# Patient Record
Sex: Female | Born: 1955 | Race: Black or African American | Hispanic: No | Marital: Single | Smoking: Current every day smoker
Health system: Southern US, Community
[De-identification: ages and names within clinical notes are randomized; demographics above are authoritative.]

## PROBLEM LIST (undated history)

## (undated) DIAGNOSIS — I10 Essential (primary) hypertension: Secondary | ICD-10-CM

## (undated) HISTORY — PX: ABDOMINAL HYSTERECTOMY: SHX81

## (undated) HISTORY — PX: TOTAL ABDOMINAL HYSTERECTOMY W/ BILATERAL SALPINGOOPHORECTOMY: SHX83

## (undated) HISTORY — DX: Essential (primary) hypertension: I10

---

## 2004-06-18 ENCOUNTER — Emergency Department: Payer: Self-pay | Admitting: Internal Medicine

## 2005-07-11 ENCOUNTER — Emergency Department: Payer: Self-pay | Admitting: Internal Medicine

## 2010-05-15 ENCOUNTER — Inpatient Hospital Stay: Payer: Self-pay | Admitting: Obstetrics and Gynecology

## 2010-05-18 LAB — PATHOLOGY REPORT

## 2013-08-25 ENCOUNTER — Emergency Department: Payer: Self-pay | Admitting: Emergency Medicine

## 2015-12-24 ENCOUNTER — Emergency Department: Payer: Self-pay

## 2015-12-24 ENCOUNTER — Emergency Department
Admission: EM | Admit: 2015-12-24 | Discharge: 2015-12-24 | Disposition: A | Discharge status: Home / Self Care | Payer: Self-pay | Attending: Emergency Medicine | Admitting: Emergency Medicine

## 2015-12-24 DIAGNOSIS — F17200 Nicotine dependence, unspecified, uncomplicated: Secondary | ICD-10-CM | POA: Insufficient documentation

## 2015-12-24 DIAGNOSIS — R51 Headache: Secondary | ICD-10-CM | POA: Insufficient documentation

## 2015-12-24 DIAGNOSIS — I10 Essential (primary) hypertension: Secondary | ICD-10-CM | POA: Insufficient documentation

## 2015-12-24 LAB — CBC
HCT: 39.8 % (ref 35.0–47.0)
Hemoglobin: 13.7 g/dL (ref 12.0–16.0)
MCH: 25.5 pg — AB (ref 26.0–34.0)
MCHC: 34.3 g/dL (ref 32.0–36.0)
MCV: 74.3 fL — ABNORMAL LOW (ref 80.0–100.0)
PLATELETS: 236 10*3/uL (ref 150–440)
RBC: 5.36 MIL/uL — ABNORMAL HIGH (ref 3.80–5.20)
RDW: 16.2 % — ABNORMAL HIGH (ref 11.5–14.5)
WBC: 5.5 10*3/uL (ref 3.6–11.0)

## 2015-12-24 LAB — COMPREHENSIVE METABOLIC PANEL
ALBUMIN: 4.1 g/dL (ref 3.5–5.0)
ALT: 11 U/L — AB (ref 14–54)
AST: 14 U/L — AB (ref 15–41)
Alkaline Phosphatase: 81 U/L (ref 38–126)
Anion gap: 8 (ref 5–15)
BUN: 13 mg/dL (ref 6–20)
CHLORIDE: 105 mmol/L (ref 101–111)
CO2: 25 mmol/L (ref 22–32)
CREATININE: 0.63 mg/dL (ref 0.44–1.00)
Calcium: 9.1 mg/dL (ref 8.9–10.3)
GFR calc non Af Amer: >60 mL/min (ref >60)
GLUCOSE: 102 mg/dL — AB (ref 65–99)
Potassium: 3.2 mmol/L — ABNORMAL LOW (ref 3.5–5.1)
SODIUM: 138 mmol/L (ref 135–145)
Total Bilirubin: 0.8 mg/dL (ref 0.3–1.2)
Total Protein: 7.2 g/dL (ref 6.5–8.1)

## 2015-12-24 LAB — TROPONIN I: Troponin I: <0.03 ng/mL (ref <0.03)

## 2015-12-24 MED ORDER — LABETALOL HCL 5 MG/ML IV SOLN
INTRAVENOUS | Status: DC
Start: 2015-12-24 — End: 2015-12-24
  Filled 2015-12-24: qty 4

## 2015-12-24 MED ORDER — LABETALOL HCL 5 MG/ML IV SOLN
INTRAVENOUS | Status: AC
  Administered 2015-12-24: 10 mg via INTRAVENOUS
  Filled 2015-12-24: qty 4

## 2015-12-24 MED ORDER — MORPHINE SULFATE (PF) 2 MG/ML IV SOLN
INTRAVENOUS | Status: AC
  Administered 2015-12-24: 2 mg via INTRAVENOUS
  Filled 2015-12-24: qty 1

## 2015-12-24 MED ORDER — LABETALOL HCL 5 MG/ML IV SOLN
10.0000 mg | Freq: Once | INTRAVENOUS | Status: AC
  Administered 2015-12-24: 10 mg via INTRAVENOUS

## 2015-12-24 MED ORDER — MORPHINE SULFATE (PF) 2 MG/ML IV SOLN
2.0000 mg | Freq: Once | INTRAVENOUS | Status: AC
  Administered 2015-12-24: 2 mg via INTRAVENOUS

## 2015-12-24 MED ORDER — CLONIDINE HCL 0.1 MG PO TABS
ORAL_TABLET | ORAL | Status: AC
  Administered 2015-12-24: 0.1 mg via ORAL
  Filled 2015-12-24: qty 1

## 2015-12-24 MED ORDER — AMLODIPINE BESYLATE 10 MG PO TABS: 10.0000 mg | ORAL_TABLET | Freq: Every day | ORAL | Status: DC

## 2015-12-24 NOTE — ED Notes (Signed)
Pt presents to ED via [facility] from [employer] (her job). Pt states she hasn't been feeling well the last couple of days. At work tonight she felt weak, had some chest pain. Blurred vision began yesterday. With EMS initial BP 202/100, last BP 190/98. Pt has no hx of HTN but it runs in the family. Pt c/o of headache at this moment.

## 2015-12-24 NOTE — Discharge Instructions (Signed)
Hypertension Hypertension, commonly called high blood pressure, is when the force of blood pumping through your arteries is too strong. Your arteries are the blood vessels that carry blood from your heart throughout your body. A blood pressure reading consists of a higher number over a lower number, such as 110/72. The higher number (systolic) is the pressure inside your arteries when your heart pumps. The lower number (diastolic) is the pressure inside your arteries when your heart relaxes. Ideally you want your blood pressure below 120/80. Hypertension forces your heart to work harder to pump blood. Your arteries may become narrow or stiff. Having untreated or uncontrolled hypertension can cause heart attack, stroke, kidney disease, and other problems. RISK FACTORS Some risk factors for high blood pressure are controllable. Others are not.  Risk factors you cannot control include:   Race. You may be at higher risk if you are African American.  Age. Risk increases with age.  Gender. Men are at higher risk than women before age 45 years. After age 65, women are at higher risk than men. Risk factors you can control include:  Not getting enough exercise or physical activity.  Being overweight.  Getting too much fat, sugar, calories, or salt in your diet.  Drinking too much alcohol. SIGNS AND SYMPTOMS Hypertension does not usually cause signs or symptoms. Extremely high blood pressure (hypertensive crisis) may cause headache, anxiety, shortness of breath, and nosebleed. DIAGNOSIS To check if you have hypertension, your health care provider will measure your blood pressure while you are seated, with your arm held at the level of your heart. It should be measured at least twice using the same arm. Certain conditions can cause a difference in blood pressure between your right and left arms. A blood pressure reading that is higher than normal on one occasion does not mean that you need treatment. If  it is not clear whether you have high blood pressure, you may be asked to return on a different day to have your blood pressure checked again. Or, you may be asked to monitor your blood pressure at home for 1 or more weeks. TREATMENT Treating high blood pressure includes making lifestyle changes and possibly taking medicine. Living a healthy lifestyle can help lower high blood pressure. You may need to change some of your habits. Lifestyle changes may include:  Following the DASH diet. This diet is high in fruits, vegetables, and whole grains. It is low in salt, red meat, and added sugars.  Keep your sodium intake below 2,300 mg per day.  Getting at least 30-45 minutes of aerobic exercise at least 4 times per week.  Losing weight if necessary.  Not smoking.  Limiting alcoholic beverages.  Learning ways to reduce stress. Your health care provider may prescribe medicine if lifestyle changes are not enough to get your blood pressure under control, and if one of the following is true:  You are 18-59 years of age and your systolic blood pressure is above 140.  You are 60 years of age or older, and your systolic blood pressure is above 150.  Your diastolic blood pressure is above 90.  You have diabetes, and your systolic blood pressure is over 140 or your diastolic blood pressure is over 90.  You have kidney disease and your blood pressure is above 140/90.  You have heart disease and your blood pressure is above 140/90. Your personal target blood pressure may vary depending on your medical conditions, your age, and other factors. HOME CARE INSTRUCTIONS    Have your blood pressure rechecked as directed by your health care provider.   Take medicines only as directed by your health care provider. Follow the directions carefully. Blood pressure medicines must be taken as prescribed. The medicine does not work as well when you skip doses. Skipping doses also puts you at risk for  problems.  Do not smoke.   Monitor your blood pressure at home as directed by your health care provider. SEEK MEDICAL CARE IF:   You think you are having a reaction to medicines taken.  You have recurrent headaches or feel dizzy.  You have swelling in your ankles.  You have trouble with your vision. SEEK IMMEDIATE MEDICAL CARE IF:  You develop a severe headache or confusion.  You have unusual weakness, numbness, or feel faint.  You have severe chest or abdominal pain.  You vomit repeatedly.  You have trouble breathing. MAKE SURE YOU:   Understand these instructions.  Will watch your condition.  Will get help right away if you are not doing well or get worse.   This information is not intended to replace advice given to you by your health care provider. Make sure you discuss any questions you have with your health care provider.   Document Released: 05/23/2005 Document Revised: 10/07/2014 Document Reviewed: 03/15/2013 Elsevier Interactive Patient Education 2016 Elsevier Inc.  

## 2015-12-24 NOTE — ED Notes (Signed)
Family at bedside. 

## 2015-12-24 NOTE — ED Notes (Signed)
Urged patient to urinate for lab work.

## 2015-12-24 NOTE — ED Notes (Signed)
Patient returned from CT

## 2015-12-24 NOTE — ED Notes (Signed)
Told patient we needed to get urine from her to send to the lab, she says she does not have to go right now but will push the call button if she feels the need.  Commode hat placed.

## 2015-12-24 NOTE — ED Notes (Signed)
MD at bedside. 

## 2015-12-24 NOTE — ED Notes (Signed)
Patient transported to CT 

## 2015-12-24 NOTE — ED Provider Notes (Signed)
[facility] Emergency Department Provider Note  ____________________________________________  Time seen: 1:00 AM  I have reviewed the triage vital signs and the nursing notes.   HISTORY  Chief Complaint Hypertension     HPI Sue Richardson is a 60 y.o. female presents emergency department from [facility] via EMS with complaint of generalized weakness headache blurred vision with "seeing little black things"since yesterday patient admits to 8 out of 10 headache that is generalized. Patient's blood pressure noted to be 202/100 by EMS. Patient denies any weakness numbness or gait instability. Patient denies any change in speech. Patient denies any chest pain at this time Patient denies any previous history of hypertension states that she has not seen a doctor in many many years     No past medical history on file.  There are no active problems to display for this patient.   Past Surgical History  Procedure Laterality Date  . Abdominal hysterectomy      No current outpatient prescriptions on file.  Allergies Review of patient's allergies indicates no known allergies.  No family history on file.  Social History Social History  Substance Use Topics  . Smoking status: Current Every Day Smoker  . Smokeless tobacco: Not on file  . Alcohol Use: No    Review of Systems  Constitutional: Negative for fever. Eyes: Negative for visual changes. ENT: Negative for sore throat. Cardiovascular: Negative for chest pain. Respiratory: Negative for shortness of breath. Gastrointestinal: Negative for abdominal pain, vomiting and diarrhea. Genitourinary: Negative for dysuria. Musculoskeletal: Negative for back pain. Skin: Negative for rash. Neurological: Positive for headaches.   10-point ROS otherwise negative.  ____________________________________________   PHYSICAL EXAM:  VITAL SIGNS: ED Triage Vitals  Enc Vitals Group     BP 12/24/15 0050  212/102 mmHg     Pulse Rate 12/24/15 0050 70     Resp 12/24/15 0050 14     Temp 12/24/15 0050 97.5 F (36.4 C)     Temp Source 12/24/15 0050 Oral     SpO2 12/24/15 0050 100 %     Weight 12/24/15 0050 168 lb (76.204 kg)     Height 12/24/15 0050  (1.702 m)     Head Cir --      Peak Flow --      Pain Score 12/24/15 0051 9     Pain Loc --      Pain Edu? --      Excl. in GC? --      Constitutional: Alert and oriented. Well appearing and in no distress. Eyes: Conjunctivae are normal. PERRL. Normal extraocular movements. ENT   Head: Normocephalic and atraumatic.   Nose: No congestion/rhinnorhea.   Mouth/Throat: Mucous membranes are moist.   Neck: No stridor. Hematological/Lymphatic/Immunilogical: No cervical lymphadenopathy. Cardiovascular: Normal rate, regular rhythm. Normal and symmetric distal pulses are present in all extremities. No murmurs, rubs, or gallops. Respiratory: Normal respiratory effort without tachypnea nor retractions. Breath sounds are clear and equal bilaterally. No wheezes/rales/rhonchi. Gastrointestinal: Soft and nontender. No distention. There is no CVA tenderness. Genitourinary: deferred Musculoskeletal: Nontender with normal range of motion in all extremities. No joint effusions.  No lower extremity tenderness nor edema. Neurologic:  Normal speech and language. No gross focal neurologic deficits are appreciated. Speech is normal.  Skin:  Skin is warm, dry and intact. No rash noted. Psychiatric: Mood and affect are normal. Speech and behavior are normal. Patient exhibits appropriate insight and judgment.  ____________________________________________    LABS (pertinent positives/negatives) Labs  Reviewed  CBC - Abnormal; Notable for the following:    RBC 5.36 (*)    MCV 74.3 (*)    MCH 25.5 (*)    RDW 16.2 (*)    All other components within normal limits  COMPREHENSIVE METABOLIC PANEL - Abnormal; Notable for the following:    Potassium  3.2 (*)    Glucose, Bld 102 (*)    AST 14 (*)    ALT 11 (*)    All other components within normal limits  TROPONIN I  URINE DRUG SCREEN, QUALITATIVE ([facility] ONLY)     ____________________________________________   EKG  ED ECG REPORT I, Sentinel N Isiah Scheel, the attending physician, personally viewed and interpreted this ECG.   Date: 12/24/2015  EKG Time: 12:48AM  Rate: 59  Rhythm: Normal Sinus Rhythm  Axis: Normal  Intervals:Normal  ST&T Change: None   ____________________________________________    RADIOLOGY     CT Head Wo Contrast (Final result) Result time: 12/24/15 01:57:41   Final result by Rad Results In Interface (12/24/15 01:57:41)   Narrative:   CLINICAL DATA: Weakness, chest pain and blurry vision for a few days, hypertensive. Headache.  EXAM: CT HEAD WITHOUT CONTRAST  TECHNIQUE: Contiguous axial images were obtained from the base of the skull through the vertex without intravenous contrast.  COMPARISON: None.  FINDINGS: INTRACRANIAL CONTENTS: The ventricles and sulci are normal. No intraparenchymal hemorrhage, mass effect nor midline shift. No acute large vascular territory infarcts. No abnormal extra-axial fluid collections. Basal cisterns are patent. Trace calcific atherosclerosis the carotid siphons.  ORBITS: The included ocular globes and orbital contents are normal.  SINUSES: The mastoid aircells and included paranasal sinuses are well-aerated.  SKULL/SOFT TISSUES: No skull fracture. No significant soft tissue swelling.  IMPRESSION: Normal CT HEAD.   Electronically Signed By: Awilda Metroourtnay Bloomer M.D. On: 12/24/2015 01:57            Procedures     INITIAL IMPRESSION / ASSESSMENT AND PLAN / ED COURSE  Pertinent labs & imaging results that were available during my care of the patient were reviewed by me and considered in my medical decision making (see chart for details).   Patient with markedly elevated blood  pressure with no PMH of hypertension &  Headache. Patient given Labetalol 10 mg within improvement of BP. CT head negative, lab data unremarkable EKG negative  ____________________________________________   FINAL CLINICAL IMPRESSION(S) / ED DIAGNOSES  Final diagnoses:  Essential hypertension      Darci Currentandolph N Suzann Lazaro, MD 12/24/15 [phone]

## 2016-09-28 ENCOUNTER — Ambulatory Visit
Admission: RE | Admit: 2016-09-28 | Discharge: 2016-09-28 | Disposition: A | Discharge status: Home / Self Care | Payer: Self-pay | Source: Ambulatory Visit | Attending: Oncology | Admitting: Oncology

## 2016-09-28 ENCOUNTER — Encounter: Payer: Self-pay | Admitting: *Deleted

## 2016-09-28 ENCOUNTER — Ambulatory Visit: Payer: Self-pay | Attending: Oncology | Admitting: *Deleted

## 2016-09-28 VITALS — BP 121/79 | HR 76 | Temp 97.9°F | Ht 65.0 in | Wt 187.0 lb

## 2016-09-28 DIAGNOSIS — Z0000 Encounter for general adult medical examination without abnormal findings: Secondary | ICD-10-CM

## 2016-09-28 NOTE — Patient Instructions (Signed)
Gave patient hand-out, Women Staying Healthy, Active and Well from [facility], with education on breast health, pap smears, heart and colon health. 

## 2016-09-28 NOTE — Progress Notes (Signed)
Subjective:     Patient ID: Sue Richardson, female   DOB: [birth year], 61 y.o.   MRN: [id number]  HPI   Review of Systems     Objective:   Physical Exam  Pulmonary/Chest: Right breast exhibits no inverted nipple, no mass, no nipple discharge, no skin change and no tenderness. Left breast exhibits tenderness. Left breast exhibits no inverted nipple, no mass, no nipple discharge and no skin change. Breasts are asymmetrical.  Right breast 1-2 cup sizes larger than the left -        Assessment:     61 year old Black female presents to [facility] for clinical breast exam and mammogram only.  Patient states she has been having intermittent left breast pain for about 6 months.  States it under her breast, feels heavy, and stabbing at times.  No aggravating factors, and states she is better since being on her blood pressure meds for the last 2 months.  When asked about associated symptoms, she states she breaks out in a sweat and feels heaviness in her chest when she has this pain.  Reviewed signs and symptoms of cardiac arrest.  Encouraged her to go to the ED the next time she experiences this type of pain, and to inform her primary care provider at her follow-up appointment next month.  Taught self breast awareness.  Patient has had a total hysterectomy for heavy bleeding.  Pap smear omitted per protocol.  Patient has been screened for eligibility.  She does not have any insurance, Medicare or Medicaid.  She also meets financial eligibility.  Hand-out given on the Affordable Care Act.    Plan:     Screening mammogram ordered.  Will follow-up per protocol.

## 2016-10-04 ENCOUNTER — Encounter: Payer: Self-pay | Admitting: *Deleted

## 2016-10-04 NOTE — Progress Notes (Signed)
Letter mailed from the [facility] to inform patient of her normal mammogram results.  Patient is to follow-up with annual screening in one year.  [facility] to [facility]y. 

## 2017-06-11 IMAGING — CT CT HEAD W/O CM
3 series · 16 of 47 positions shown, 19 images · non-contrast
Comparison: None.

CLINICAL DATA: Weakness, chest pain and blurry vision for a few
days, hypertensive. Headache.

EXAM:
CT HEAD WITHOUT CONTRAST
TECHNIQUE: Contiguous axial images were obtained from the base of the skull
through the vertex without intravenous contrast.

[Series 2: head wo · axial · 0.38mm/px · z∈[-52,+73]mm · 10 of 31 slices shown, 13 images]
[im 3/31  brain]
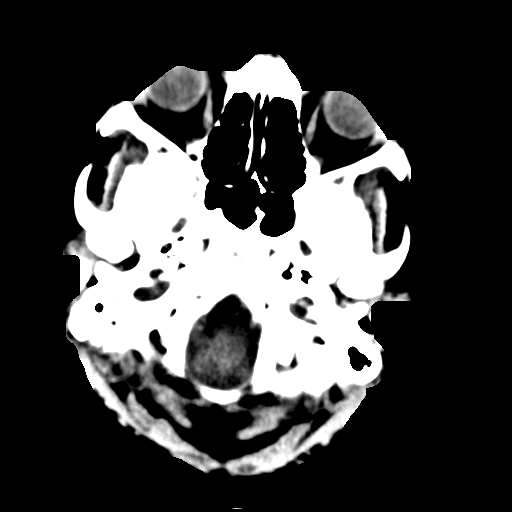
[im 3/31  bone]
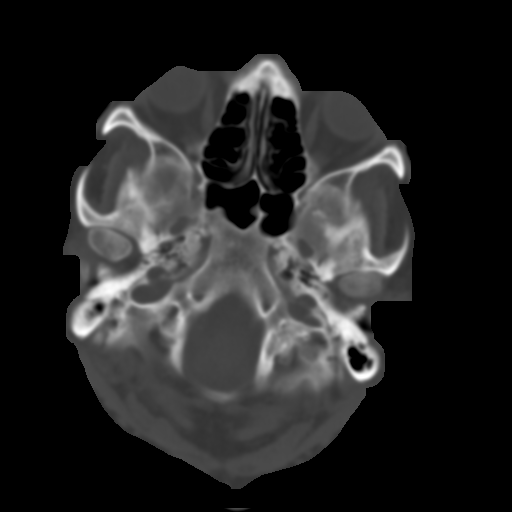
[im 6/31  brain]
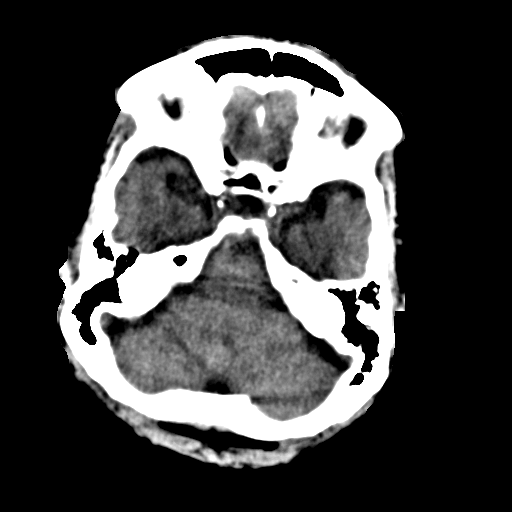
[im 9/31  brain]
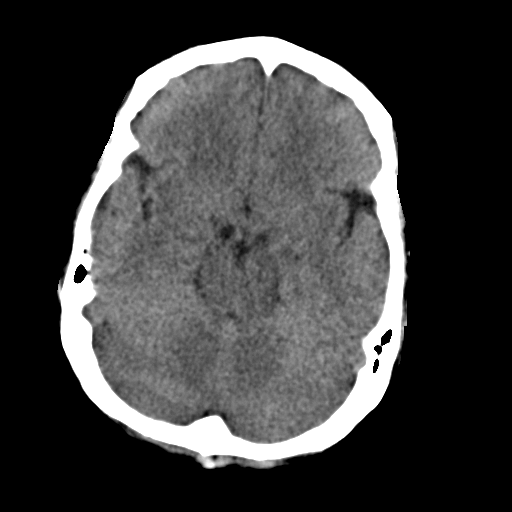
[im 11/31  brain]
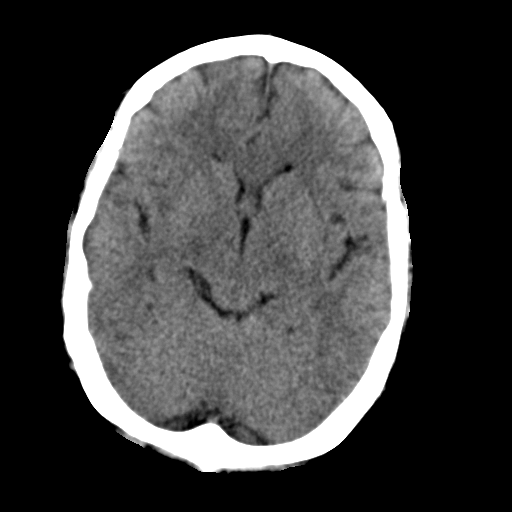
[im 14/31  brain]
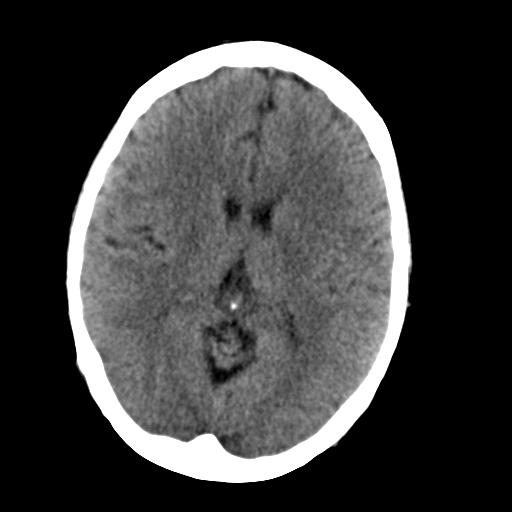
[im 14/31  bone]
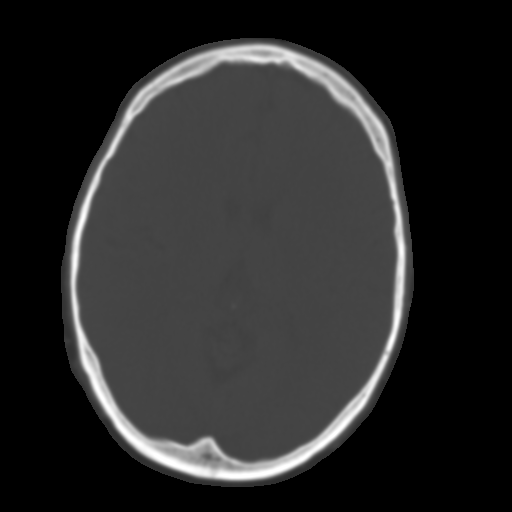
[im 17/31  brain]
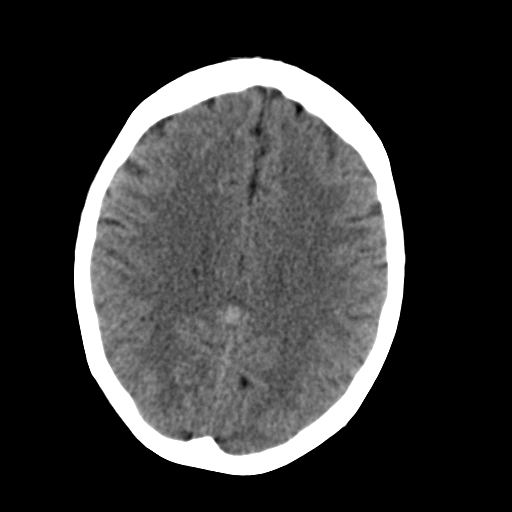
[im 20/31  brain]
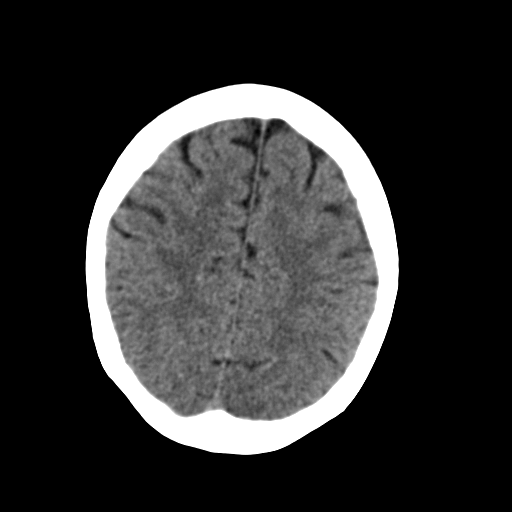
[im 23/31  brain]
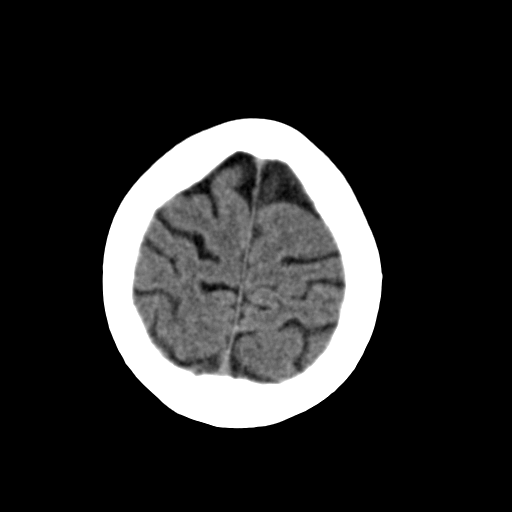
[im 25/31  brain]
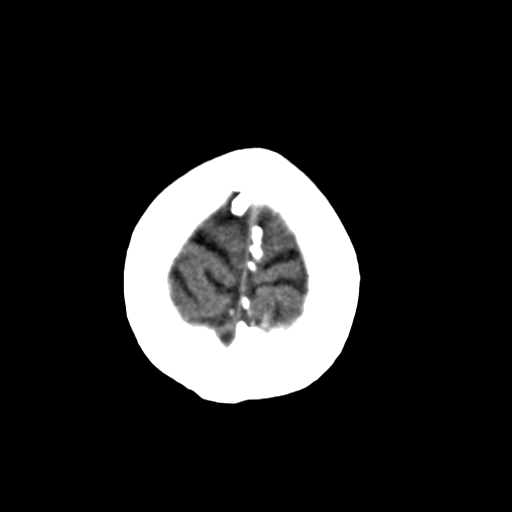
[im 25/31  bone]
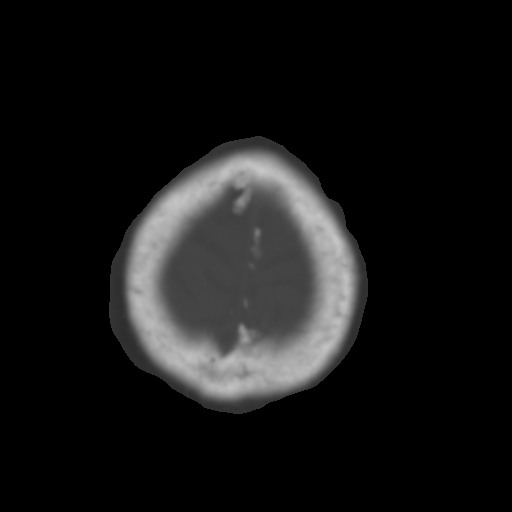
[im 28/31  brain]
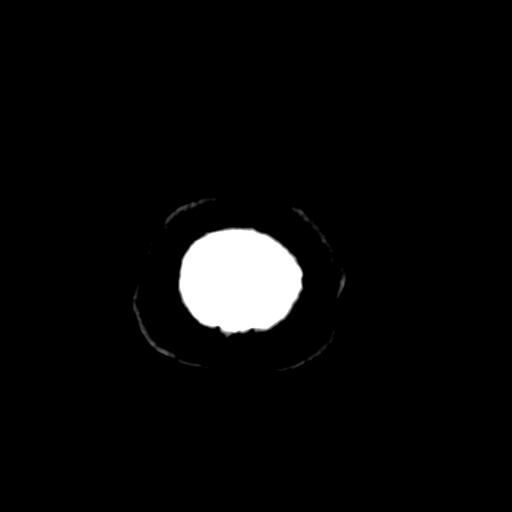

[Series 4: coronal soft · coronal · 0.32mm/px · 3 of 55 slices shown]
[im 19/55  brain]
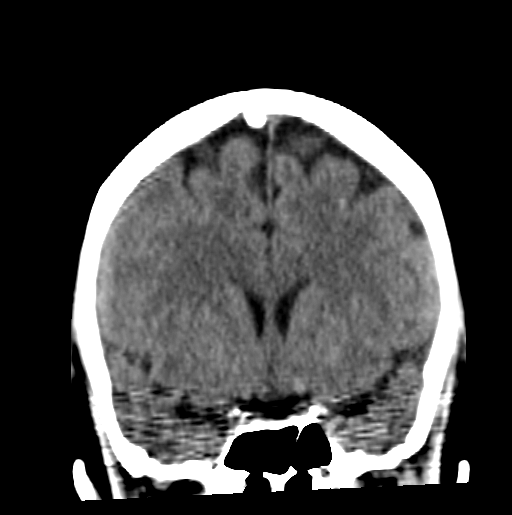
[im 25/55  brain]
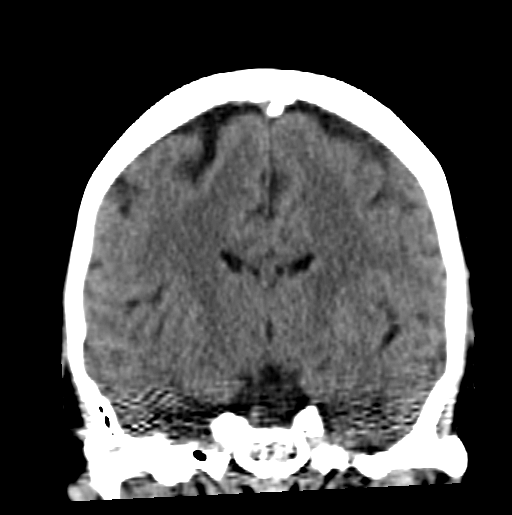
[im 31/55  brain]
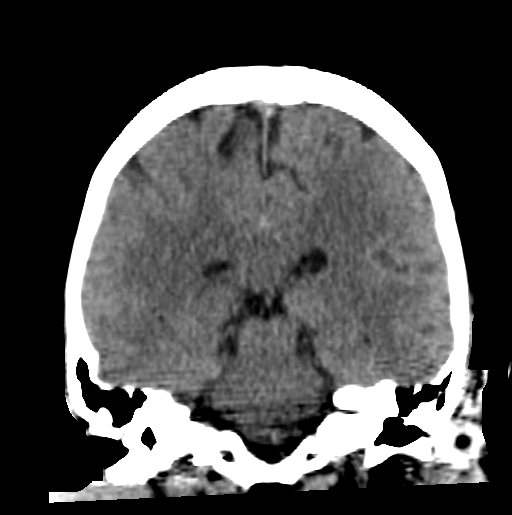

[Series 5: sagittal soft · sagittal · 0.30mm/px · 3 of 48 slices shown]
[im 16/48  brain]
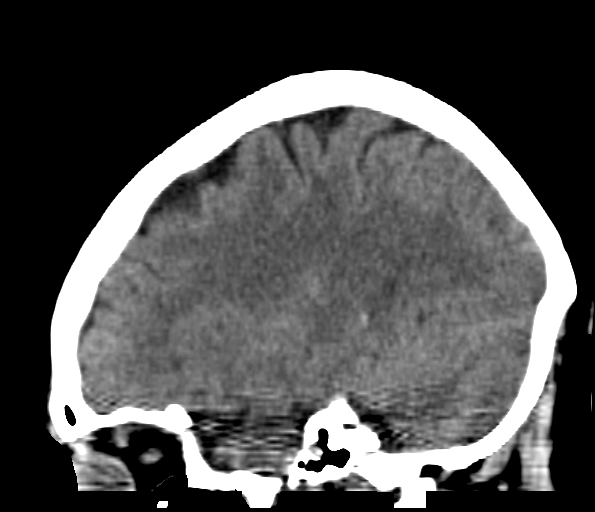
[im 24/48  brain]
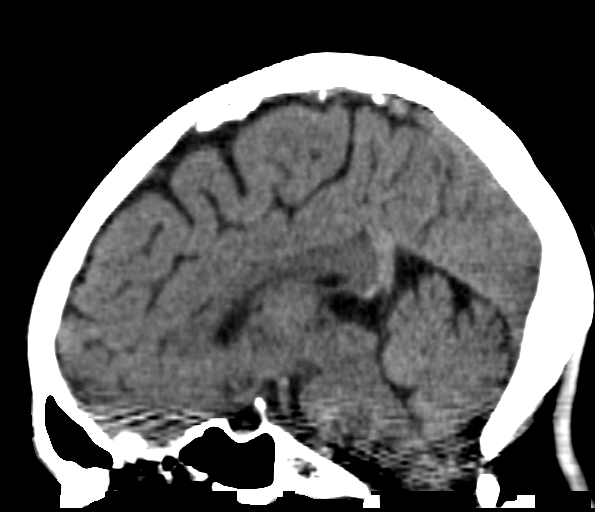
[im 32/48  brain]
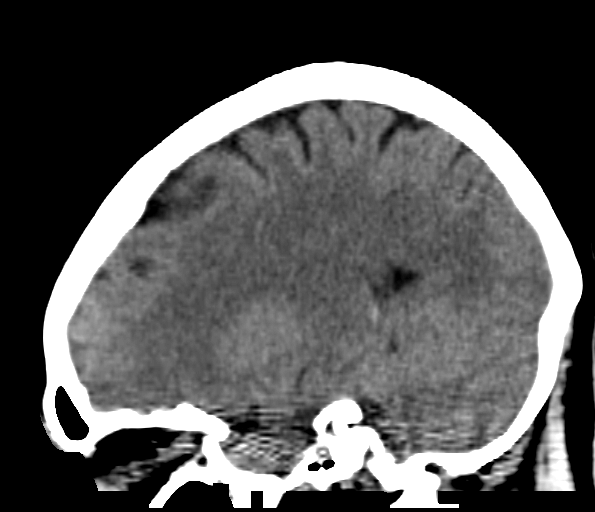

[16 of 47 positions shown; findings below may reference images not displayed]

FINDINGS: INTRACRANIAL CONTENTS: The ventricles and sulci are normal. No
intraparenchymal hemorrhage, mass effect nor midline shift. No acute
large vascular territory infarcts. No abnormal extra-axial fluid
collections. Basal cisterns are patent. Trace calcific
atherosclerosis the carotid siphons.

ORBITS: The included ocular globes and orbital contents are normal.

SINUSES: The mastoid aircells and included paranasal sinuses are
well-aerated.

SKULL/SOFT TISSUES: No skull fracture. No significant soft tissue
swelling.
IMPRESSION: Normal CT HEAD.

## 2019-03-27 ENCOUNTER — Ambulatory Visit
Admission: RE | Admit: 2019-03-27 | Discharge: 2019-03-27 | Disposition: A | Discharge status: Home / Self Care | Payer: Self-pay | Source: Ambulatory Visit | Attending: Oncology | Admitting: Oncology

## 2019-03-27 ENCOUNTER — Other Ambulatory Visit: Payer: Self-pay

## 2019-03-27 ENCOUNTER — Ambulatory Visit: Payer: Self-pay | Attending: Oncology

## 2019-03-27 VITALS — BP 199/91 | HR 66 | Temp 98.4°F | Ht 64.0 in | Wt 185.6 lb

## 2019-03-27 DIAGNOSIS — Z0000 Encounter for general adult medical examination without abnormal findings: Secondary | ICD-10-CM | POA: Insufficient documentation

## 2019-03-27 NOTE — Progress Notes (Signed)
  Subjective:     Patient ID: Sue Richardson, female   DOB: 23-Feb-1956, 63 y.o.   MRN: [id number]  HPI   Review of Systems     Objective:   Physical Exam Chest:     Breasts:        Right: No swelling, bleeding, inverted nipple, mass, nipple discharge, skin change or tenderness.        Left: No swelling, bleeding, inverted nipple, mass, nipple discharge, skin change or tenderness.     Comments: Large pendulous breasts       Assessment:     63 year old patient presents for [facility] clinic visit.  Patient screened, and meets [facility] eligibility.  Patient does not have insurance, Medicare or Medicaid. Instructed patient on breast self awareness using teach back method.  Clinical breast exam unremarkable.  No mass or lump palpated. Date of birth [birth year]    Plan:     Sent for bilateral screening mammogram.

## 2019-03-28 NOTE — Progress Notes (Signed)
Letter mailed from [facility] to notify of normal mammogram results.  Patient to return in one year for annual screening.  Copy to [facility]. 

## 2021-03-26 ENCOUNTER — Ambulatory Visit (INDEPENDENT_AMBULATORY_CARE_PROVIDER_SITE_OTHER): Payer: Self-pay | Admitting: Nurse Practitioner

## 2021-03-26 ENCOUNTER — Other Ambulatory Visit: Payer: Self-pay

## 2021-03-26 ENCOUNTER — Encounter: Payer: Self-pay | Admitting: Nurse Practitioner

## 2021-03-26 VITALS — BP 131/69 | HR 81 | Temp 98.6°F | Ht 63.0 in | Wt 177.2 lb

## 2021-03-26 DIAGNOSIS — M25511 Pain in right shoulder: Secondary | ICD-10-CM

## 2021-03-26 DIAGNOSIS — G8929 Other chronic pain: Secondary | ICD-10-CM

## 2021-03-26 DIAGNOSIS — M545 Low back pain, unspecified: Secondary | ICD-10-CM

## 2021-03-26 DIAGNOSIS — Z599 Problem related to housing and economic circumstances, unspecified: Secondary | ICD-10-CM

## 2021-03-26 DIAGNOSIS — I10 Essential (primary) hypertension: Secondary | ICD-10-CM

## 2021-03-26 DIAGNOSIS — Z7689 Persons encountering health services in other specified circumstances: Secondary | ICD-10-CM

## 2021-03-26 DIAGNOSIS — E7800 Pure hypercholesterolemia, unspecified: Secondary | ICD-10-CM

## 2021-03-26 DIAGNOSIS — M25512 Pain in left shoulder: Secondary | ICD-10-CM

## 2021-03-26 MED ORDER — MELOXICAM 7.5 MG PO TABS
7.5000 mg | ORAL_TABLET | Freq: Every day | ORAL | 0 refills | Status: DC
  Filled 2021-03-26: qty 30, 30d supply, fill #0

## 2021-03-26 NOTE — Progress Notes (Signed)
BP 131/69   Pulse 81   Temp 98.6 F (37 C) (Oral)   Ht 5' 3"  (1.6 m)   Wt 177 lb 3.2 oz (80.4 kg)   SpO2 100%   BMI 31.39 kg/m    Subjective:    Patient ID: Sue Richardson, female    DOB: Mar 05, 1956, 65 y.o.   MRN: [id number]  HPI: Sue Richardson is a 65 y.o. female  Chief Complaint  Patient presents with   Establish Care    Patient states she fell back at work about 10 months ago. Patient states she had imaging completed and was informed that she has Arthritis and states she has been having issues with her shoulder, arm, neck and back since the fall. Patient states she has pain in both arms, but her left arm is worse than her right. As the left arm will catch and cause her pain.    Shoulder Pain   Neck Pain   Arm Pain   Back Pain   Toe Injury    Patient sister states the patient was doing some work and think she may have sprang her R big toe as it was swelling and it will come down and then go back to swelling. Patient would like to discuss with provider.    Patient presents to clinic to establish care with new PCP.  Patient reports a history of hypertension and high cholesterol.  Patient denies a history of:  Diabetes, Thyroid problems, Depression, Anxiety, Neurological problems, and Abdominal problems.   Patient states she fell at work 10 months.  Did not go through work [employer].  Since that time she has been having a lot of pain in her joints.  States that the imaging at that time showed that she has some arthritis.   TOE INJURY Patient states she was doing something and she turned around and had pain in her toe.  Patient states she thought it would go away.  States that it swelled up.  Patient states that was about 1 week ago.  Swelling is improving gradually.    Denies HA, CP, SOB, dizziness, palpitations, visual changes, and lower extremity swelling.   Active Ambulatory Problems    Diagnosis Date Noted   Primary hypertension 03/26/2021    Hypercholesteremia 03/26/2021   Resolved Ambulatory Problems    Diagnosis Date Noted   No Resolved Ambulatory Problems   Past Medical History:  Diagnosis Date   Hypertension    Past Surgical History:  Procedure Laterality Date   ABDOMINAL HYSTERECTOMY     Family History  Problem Relation Age of Onset   COPD Sister    Hypertension Sister    Stroke Sister    Breast cancer Neg Hx     Review of Systems  Eyes:  Negative for visual disturbance.  Respiratory:  Negative for cough, chest tightness and shortness of breath.   Cardiovascular:  Negative for chest pain, palpitations and leg swelling.  Musculoskeletal:  Positive for arthralgias.  Neurological:  Negative for dizziness and headaches.   Per HPI unless specifically indicated above     Objective:    BP 131/69   Pulse 81   Temp 98.6 F (37 C) (Oral)   Ht 5' 3"  (1.6 m)   Wt 177 lb 3.2 oz (80.4 kg)   SpO2 100%   BMI 31.39 kg/m   Wt Readings from Last 3 Encounters:  03/26/21 177 lb 3.2 oz (80.4 kg)  03/27/19 185 lb 9.6 oz (84.2 kg)  09/28/16 187 lb (84.8 kg)    Physical Exam Vitals and nursing note reviewed.  Constitutional:      General: She is not in acute distress.    Appearance: Normal appearance. She is normal weight. She is not ill-appearing, toxic-appearing or diaphoretic.  HENT:     Head: Normocephalic.     Right Ear: External ear normal.     Left Ear: External ear normal.     Nose: Nose normal.     Mouth/Throat:     Mouth: Mucous membranes are moist.     Pharynx: Oropharynx is clear.  Eyes:     General:        Right eye: No discharge.        Left eye: No discharge.     Extraocular Movements: Extraocular movements intact.     Conjunctiva/sclera: Conjunctivae normal.     Pupils: Pupils are equal, round, and reactive to light.  Cardiovascular:     Rate and Rhythm: Normal rate and regular rhythm.     Heart sounds: No murmur heard. Pulmonary:     Effort: Pulmonary effort is normal. No respiratory  distress.     Breath sounds: Normal breath sounds. No wheezing or rales.  Musculoskeletal:     Right shoulder: Decreased range of motion. Decreased strength.     Left shoulder: Decreased range of motion. Decreased strength.     Cervical back: Normal range of motion and neck supple.  Skin:    General: Skin is warm and dry.     Capillary Refill: Capillary refill takes less than 2 seconds.  Neurological:     General: No focal deficit present.     Mental Status: She is alert and oriented to person, place, and time. Mental status is at baseline.  Psychiatric:        Mood and Affect: Mood normal.        Behavior: Behavior normal.        Thought Content: Thought content normal.        Judgment: Judgment normal.    Results for orders placed or performed during the hospital encounter of 12/24/15  CBC  Result Value Ref Range   WBC 5.5 3.6 - 11.0 K/uL   RBC 5.36 (H) 3.80 - 5.20 MIL/uL   Hemoglobin 13.7 12.0 - 16.0 g/dL   HCT 39.8 35.0 - 47.0 %   MCV 74.3 (L) 80.0 - 100.0 fL   MCH 25.5 (L) 26.0 - 34.0 pg   MCHC 34.3 32.0 - 36.0 g/dL   RDW 16.2 (H) 11.5 - 14.5 %   Platelets 236 150 - 440 K/uL  Comprehensive metabolic panel  Result Value Ref Range   Sodium 138 135 - 145 mmol/L   Potassium 3.2 (L) 3.5 - 5.1 mmol/L   Chloride 105 101 - 111 mmol/L   CO2 25 22 - 32 mmol/L   Glucose, Bld 102 (H) 65 - 99 mg/dL   BUN 13 6 - 20 mg/dL   Creatinine, Ser 0.63 0.44 - 1.00 mg/dL   Calcium 9.1 8.9 - 10.3 mg/dL   Total Protein 7.2 6.5 - 8.1 g/dL   Albumin 4.1 3.5 - 5.0 g/dL   AST 14 (L) 15 - 41 U/L   ALT 11 (L) 14 - 54 U/L   Alkaline Phosphatase 81 38 - 126 U/L   Total Bilirubin 0.8 0.3 - 1.2 mg/dL   GFR calc non Af Amer >60 >60 mL/min   GFR calc Af Amer >60 >60 mL/min   Anion  gap 8 5 - 15  Troponin I  Result Value Ref Range   Troponin I <0.03 <0.03 ng/mL      Assessment & Plan:   Problem List Items Addressed This Visit       Cardiovascular and Mediastinum   Primary hypertension -  Primary    Chronic.  Controlled.  Continue with current medication regimen.  Labs ordered today.  Return to clinic in 1 months for reevaluation.  Call sooner if concerns arise.        Relevant Medications   atorvastatin (LIPITOR) 20 MG tablet   Other Relevant Orders   Comp Met (CMET)     Other   Hypercholesteremia    Chronic.  Controlled.  Continue with current medication regimen.  Labs ordered today.  Refills sent today.  Return to clinic in 1 months for reevaluation.  Call sooner if concerns arise.        Relevant Medications   atorvastatin (LIPITOR) 20 MG tablet   Other Relevant Orders   Lipid Profile   Other Visit Diagnoses     Financial difficulties       Referral placed for patient to speak with social worker for financial assistance with healthcare.    Relevant Orders   [facility]   Chronic pain of both shoulders       Referral placed for patient to do physical therapy. Would like patient to see [facility] due to no insurance. If charity care is an option would like to do imaging.   Relevant Medications   meloxicam (MOBIC) 7.5 MG tablet   Other Relevant Orders   Ambulatory referral to Physical Therapy   Chronic bilateral low back pain, unspecified whether sciatica present       Relevant Medications   meloxicam (MOBIC) 7.5 MG tablet   Other Relevant Orders   Ambulatory referral to Physical Therapy   Encounter to establish care            Follow up plan: Return in about 1 month (around 04/26/2021) for Medication managment.

## 2021-03-26 NOTE — Assessment & Plan Note (Signed)
Chronic.  Controlled.  Continue with current medication regimen.  Labs ordered today.  Return to clinic in 1 months for reevaluation.  Call sooner if concerns arise.   

## 2021-03-26 NOTE — Assessment & Plan Note (Signed)
Chronic.  Controlled.  Continue with current medication regimen.  Labs ordered today.  Refills sent today.  Return to clinic in 1 months for reevaluation.  Call sooner if concerns arise.

## 2021-03-27 LAB — COMPREHENSIVE METABOLIC PANEL
ALT: 10 IU/L (ref 0–32)
AST: 10 IU/L (ref 0–40)
Albumin/Globulin Ratio: 1.8 (ref 1.2–2.2)
Albumin: 4.6 g/dL (ref 3.8–4.8)
Alkaline Phosphatase: 96 IU/L (ref 44–121)
BUN/Creatinine Ratio: 23 (ref 12–28)
BUN: 14 mg/dL (ref 8–27)
Bilirubin Total: 0.3 mg/dL (ref 0.0–1.2)
CO2: 21 mmol/L (ref 20–29)
Calcium: 9.1 mg/dL (ref 8.7–10.3)
Chloride: 104 mmol/L (ref 96–106)
Creatinine, Ser: 0.61 mg/dL (ref 0.57–1.00)
Globulin, Total: 2.5 g/dL (ref 1.5–4.5)
Glucose: 82 mg/dL (ref 70–99)
Potassium: 4 mmol/L (ref 3.5–5.2)
Sodium: 143 mmol/L (ref 134–144)
Total Protein: 7.1 g/dL (ref 6.0–8.5)
eGFR: 100 mL/min/{1.73_m2} (ref >59)

## 2021-03-27 LAB — LIPID PANEL
Chol/HDL Ratio: 2.1 ratio (ref 0.0–4.4)
Cholesterol, Total: 177 mg/dL (ref 100–199)
HDL: 83 mg/dL (ref >39)
LDL Chol Calc (NIH): 86 mg/dL (ref 0–99)
Triglycerides: 38 mg/dL (ref 0–149)
VLDL Cholesterol Cal: 8 mg/dL (ref 5–40)

## 2021-03-29 NOTE — Progress Notes (Signed)
Hi Sue Richardson.  It was nice to meet you last week. Your lab work looks good.  No concerns at this time. Continue with your current medication regimen.  Follow up as discussed.  Please let me know if you have any questions.

## 2021-04-02 ENCOUNTER — Telehealth: Payer: Self-pay | Admitting: *Deleted

## 2021-04-02 NOTE — Telephone Encounter (Signed)
   Telephone encounter was:  Unsuccessful.  04/02/2021 Name: Sue Richardson MRN: [id number] DOB: [birth year]  Unsuccessful outbound call made today to assist with:  Food Insecurity  Outreach Attempt:  1st Attempt  A HIPAA compliant voice message was left requesting a return call.  Instructed patient to call back at   Instructed patient to call back at [phone]  at their earliest convenience. Yehuda Mao Greenauer -[facility] Guide , Embedded Care Coordination [facility], Care Management  [phone] [address] [place] , [place] [address] Email : [email or web address]

## 2021-04-12 ENCOUNTER — Telehealth: Payer: Self-pay | Admitting: *Deleted

## 2021-04-12 NOTE — Telephone Encounter (Signed)
   Telephone encounter was:  Unsuccessful.  04/12/2021 Name: Sue Richardson MRN: [id number] DOB: 02/19/1956  Unsuccessful outbound call made today to assist with:   insurance   Outreach Attempt:  1st Attempt  A HIPAA compliant voice message was left requesting a return call.  Instructed patient to call back at   Instructed patient to call back at [phone]  at their earliest convenience. Yehuda Mao Greenauer -[facility] Guide , Embedded Care Coordination [facility], Care Management  [phone] [address] [place] , [place] [address] Email : [email or web address]

## 2021-04-13 ENCOUNTER — Telehealth: Payer: Self-pay | Admitting: *Deleted

## 2021-04-13 NOTE — Telephone Encounter (Signed)
   Telephone encounter was:  Unsuccessful.  04/13/2021 Name: Sue Richardson MRN: [id number] DOB: Jul 17, 1955  Unsuccessful outbound call made today to assist with:  Food Insecurity  Outreach Attempt:  2nd Attempt  Cannot be reached at this time  Alois Cliche -[facility] Guide , Embedded Care Coordination [facility], Care Management  [phone] [address] [place] , [place] [address] Email : [email or web address]

## 2021-04-14 ENCOUNTER — Telehealth: Payer: Self-pay | Admitting: *Deleted

## 2021-04-14 NOTE — Telephone Encounter (Signed)
   Telephone encounter was:  Successful.  04/14/2021 Name: Sue Richardson MRN: [id number] DOB: 1955/06/23  Sue Richardson is a 65 y.o. year old female who is a primary care patient of Larae Grooms, NP . The community resource team was consulted for assistance with  Medicaid   Care guide performed the following interventions: Patient provided with information about care guide support team and interviewed to confirm resource needs Follow up call placed to community resources to determine status of patients referral.  Follow Up Plan:  No further follow up planned at this time. The patient has been provided with needed resources. Alois Cliche -[facility] Guide , Embedded Care Coordination [facility], Care Management  [phone] [address] , [place] [address] Email : [email or web address]

## 2021-04-23 NOTE — Progress Notes (Signed)
BP (!) 149/82   Pulse 79   Temp (!) 97.4 F (36.3 C) (Oral)   Ht _0  (1.6 m)   Wt 172 lb 9.6 oz (78.3 kg)   SpO2 99%   BMI 30.57 kg/m    Subjective:    Patient ID: Sue Richardson, female    DOB: 1955/07/28, 65 y.o.   MRN: [id number]  HPI: Sue Richardson is a 65 y.o. female  Chief Complaint  Patient presents with   Hyperlipidemia   Hypertension   PAIN Patient states her pain is still really bad.  States that the Mobic isn't helping her pain.    HYPERTENSION- patient did not take her medication today. Hypertension status: controlled  Satisfied with current treatment? yes Duration of hypertension: years BP monitoring frequency:  not checking BP range:  BP medication side effects:  no Medication compliance: excellent compliance Previous BP meds:amlodipine Aspirin: no Recurrent headaches: no Visual changes: no Palpitations: no Dyspnea: no Chest pain: no Lower extremity edema: no Dizzy/lightheaded: no  Relevant past medical, surgical, family and social history reviewed and updated as indicated. Interim medical history since our last visit reviewed. Allergies and medications reviewed and updated.  Review of Systems  Eyes:  Negative for visual disturbance.  Respiratory:  Negative for cough, chest tightness and shortness of breath.   Cardiovascular:  Negative for chest pain, palpitations and leg swelling.  Musculoskeletal:        Chronic pain of both shoulders  Neurological:  Negative for dizziness and headaches.   Per HPI unless specifically indicated above     Objective:    BP (!) 149/82   Pulse 79   Temp (!) 97.4 F (36.3 C) (Oral)   Ht _1  (1.6 m)   Wt 172 lb 9.6 oz (78.3 kg)   SpO2 99%   BMI 30.57 kg/m   Wt Readings from Last 3 Encounters:  04/26/21 172 lb 9.6 oz (78.3 kg)  03/26/21 177 lb 3.2 oz (80.4 kg)  03/27/19 185 lb 9.6 oz (84.2 kg)    Physical Exam Vitals and nursing note reviewed.  Constitutional:       General: She is not in acute distress.    Appearance: Normal appearance. She is normal weight. She is not ill-appearing, toxic-appearing or diaphoretic.  HENT:     Head: Normocephalic.     Right Ear: External ear normal.     Left Ear: External ear normal.     Nose: Nose normal.     Mouth/Throat:     Mouth: Mucous membranes are moist.     Pharynx: Oropharynx is clear.  Eyes:     General:        Right eye: No discharge.        Left eye: No discharge.     Extraocular Movements: Extraocular movements intact.     Conjunctiva/sclera: Conjunctivae normal.     Pupils: Pupils are equal, round, and reactive to light.  Cardiovascular:     Rate and Rhythm: Normal rate and regular rhythm.     Heart sounds: No murmur heard. Pulmonary:     Effort: Pulmonary effort is normal. No respiratory distress.     Breath sounds: Normal breath sounds. No wheezing or rales.  Musculoskeletal:     Cervical back: Normal range of motion and neck supple.  Skin:    General: Skin is warm and dry.     Capillary Refill: Capillary refill takes less than 2 seconds.  Neurological:     General:  No focal deficit present.     Mental Status: She is alert and oriented to person, place, and time. Mental status is at baseline.  Psychiatric:        Mood and Affect: Mood normal.        Behavior: Behavior normal.        Thought Content: Thought content normal.        Judgment: Judgment normal.    Results for orders placed or performed in visit on 03/26/21  Comp Met (CMET)  Result Value Ref Range   Glucose 82 70 - 99 mg/dL   BUN 14 8 - 27 mg/dL   Creatinine, Ser 0.61 0.57 - 1.00 mg/dL   eGFR 100 >59 mL/min/1.73   BUN/Creatinine Ratio 23 12 - 28   Sodium 143 134 - 144 mmol/L   Potassium 4.0 3.5 - 5.2 mmol/L   Chloride 104 96 - 106 mmol/L   CO2 21 20 - 29 mmol/L   Calcium 9.1 8.7 - 10.3 mg/dL   Total Protein 7.1 6.0 - 8.5 g/dL   Albumin 4.6 3.8 - 4.8 g/dL   Globulin, Total 2.5 1.5 - 4.5 g/dL   Albumin/Globulin  Ratio 1.8 1.2 - 2.2   Bilirubin Total 0.3 0.0 - 1.2 mg/dL   Alkaline Phosphatase 96 44 - 121 IU/L   AST 10 0 - 40 IU/L   ALT 10 0 - 32 IU/L  Lipid Profile  Result Value Ref Range   Cholesterol, Total 177 100 - 199 mg/dL   Triglycerides 38 0 - 149 mg/dL   HDL 83 >39 mg/dL   VLDL Cholesterol Cal 8 5 - 40 mg/dL   LDL Chol Calc (NIH) 86 0 - 99 mg/dL   Chol/HDL Ratio 2.1 0.0 - 4.4 ratio      Assessment & Plan:   Problem List Items Addressed This Visit       Cardiovascular and Mediastinum   Primary hypertension - Primary    Chronic. Elevated at visit today. Patient did not take her medication this morning. Continue with Amlodipine 60m daily.  Follow up in 3 months for reevaluation.       Relevant Medications   atorvastatin (LIPITOR) 20 MG tablet     Other   Hypercholesteremia    Chronic.  Controlled.  Continue with current medication regimen of atorvastatin 224mdaily.  Refill sent today.   Return to clinic in 3 months for reevaluation.  Call sooner if concerns arise.        Relevant Medications   atorvastatin (LIPITOR) 20 MG tablet   Other Visit Diagnoses     Chronic pain of both shoulders       Will reach out to Chronic care team to see if someone is able to assist patient in apply for medicaid. [facility]        Follow up plan: Return in about 3 months (around 07/27/2021) for HTN, HLD, DM2 FU.

## 2021-04-26 ENCOUNTER — Encounter: Payer: Self-pay | Admitting: Nurse Practitioner

## 2021-04-26 ENCOUNTER — Ambulatory Visit (INDEPENDENT_AMBULATORY_CARE_PROVIDER_SITE_OTHER): Payer: Self-pay | Admitting: Nurse Practitioner

## 2021-04-26 ENCOUNTER — Other Ambulatory Visit: Payer: Self-pay

## 2021-04-26 VITALS — BP 149/82 | HR 79 | Temp 97.4°F | Ht 63.0 in | Wt 172.6 lb

## 2021-04-26 DIAGNOSIS — M25512 Pain in left shoulder: Secondary | ICD-10-CM

## 2021-04-26 DIAGNOSIS — M25511 Pain in right shoulder: Secondary | ICD-10-CM

## 2021-04-26 DIAGNOSIS — G8929 Other chronic pain: Secondary | ICD-10-CM

## 2021-04-26 DIAGNOSIS — I10 Essential (primary) hypertension: Secondary | ICD-10-CM

## 2021-04-26 DIAGNOSIS — E7800 Pure hypercholesterolemia, unspecified: Secondary | ICD-10-CM

## 2021-04-26 MED ORDER — ATORVASTATIN CALCIUM 20 MG PO TABS: 20.0000 mg | ORAL_TABLET | Freq: Every day | ORAL | 1 refills | Status: AC

## 2021-04-26 NOTE — Patient Instructions (Signed)
Referral faxed to [facility].  Phone: [phone]

## 2021-04-26 NOTE — Assessment & Plan Note (Signed)
Chronic. Elevated at visit today. Patient did not take her medication this morning. Continue with Amlodipine 10mg  daily.  Follow up in 3 months for reevaluation.

## 2021-04-26 NOTE — Assessment & Plan Note (Signed)
Chronic.  Controlled.  Continue with current medication regimen of atorvastatin 20mg  daily.  Refill sent today.   Return to clinic in 3 months for reevaluation.  Call sooner if concerns arise.

## 2021-06-29 ENCOUNTER — Telehealth: Payer: Self-pay | Admitting: Pharmacy Technician

## 2021-06-29 NOTE — Telephone Encounter (Signed)
Patient failed to provide 2023 proof of income.  No additional medication assistance will be provided by [facility] without the required proof of income documentation.  Patient notified by letter.  Sherilyn Dacosta Care Manager [facility]     [address] [place], [place]  [address]  June 28, 2021    Sue Richardson [address] [place], [place]  [address]  Dear Sue Richardson:  This is to inform you that you are no longer eligible to receive medication assistance at [facility].  The reason(s) are:    _____Your total gross monthly household income exceeds 300% of the Federal Poverty Level.   _____Tangible assets (savings, checking, stocks/bonds, pension, retirement, etc.) exceeds our limit  _____You are eligible to receive benefits from [facility], [facility] or HIV Medication              Assistance Program _____You are eligible to receive benefits from a Medicare Part D plan _____You have prescription insurance  _____You are not an [facility] resident __X__Failure to provide all requested documentation (proof of income for 2023, and/or Patient Intake Application, DOH Attestation, Contract, etc).    Medication assistance will resume once all requested documentation has been returned to our clinic.  If you have questions, please contact our clinic at [phone].    Thank you,  [facility]

## 2021-07-20 ENCOUNTER — Other Ambulatory Visit: Payer: Self-pay

## 2021-07-28 ENCOUNTER — Ambulatory Visit: Payer: Self-pay | Admitting: Nurse Practitioner

## 2021-07-28 NOTE — Progress Notes (Unsigned)
° °  There were no vitals taken for this visit.   Subjective:    Patient ID: Sue Richardson, female    DOB: 1955/11/30, 66 y.o.   MRN: [id number]  HPI: Sue Richardson is a 66 y.o. female  No chief complaint on file.  HYPERTENSION / HYPERLIPIDEMIA Satisfied with current treatment? {Blank single:19197::"yes","no"} Duration of hypertension: {Blank single:19197::"chronic","months","years"} BP monitoring frequency: {Blank single:19197::"not checking","rarely","daily","weekly","monthly","a few times a day","a few times a week","a few times a month"} BP range:  BP medication side effects: {Blank single:19197::"yes","no"} Past BP meds: {Blank BSJGGEZM:62947::"MLYY","TKPTWSFKCL","EXNTZGYFVC/BSWHQPRFFM","BWGYKZLD","JTTSVXBLTJ","QZESPQZRAQ/TMAU","QJFHLKTGYB (bystolic)","carvedilol","chlorthalidone","clonidine","diltiazem","exforge HCT","HCTZ","irbesartan (avapro)","labetalol","lisinopril","lisinopril-HCTZ","losartan (cozaar)","methyldopa","nifedipine","olmesartan (benicar)","olmesartan-HCTZ","quinapril","ramipril","spironalactone","tekturna","valsartan","valsartan-HCTZ","verapamil"} Duration of hyperlipidemia: {Blank single:19197::"chronic","months","years"} Cholesterol medication side effects: {Blank single:19197::"yes","no"} Cholesterol supplements: {Blank multiple:19196::"none","fish oil","niacin","red yeast rice"} Past cholesterol medications: {Blank multiple:19196::"none","atorvastain (lipitor)","lovastatin (mevacor)","pravastatin (pravachol)","rosuvastatin (crestor)","simvastatin (zocor)","vytorin","fenofibrate (tricor)","gemfibrozil","ezetimide (zetia)","niaspan","lovaza"} Medication compliance: {Blank single:19197::"excellent compliance","good compliance","fair compliance","poor compliance"} Aspirin: {Blank single:19197::"yes","no"} Recent stressors: {Blank single:19197::"yes","no"} Recurrent headaches: {Blank single:19197::"yes","no"} Visual changes: {Blank  single:19197::"yes","no"} Palpitations: {Blank single:19197::"yes","no"} Dyspnea: {Blank single:19197::"yes","no"} Chest pain: {Blank single:19197::"yes","no"} Lower extremity edema: {Blank single:19197::"yes","no"} Dizzy/lightheaded: {Blank single:19197::"yes","no"}  Relevant past medical, surgical, family and social history reviewed and updated as indicated. Interim medical history since our last visit reviewed. Allergies and medications reviewed and updated.  Review of Systems  Per HPI unless specifically indicated above     Objective:    There were no vitals taken for this visit.  Wt Readings from Last 3 Encounters:  04/26/21 172 lb 9.6 oz (78.3 kg)  03/26/21 177 lb 3.2 oz (80.4 kg)  03/27/19 185 lb 9.6 oz (84.2 kg)    Physical Exam  Results for orders placed or performed in visit on 03/26/21  Comp Met (CMET)  Result Value Ref Range   Glucose 82 70 - 99 mg/dL   BUN 14 8 - 27 mg/dL   Creatinine, Ser 0.61 0.57 - 1.00 mg/dL   eGFR 100 >59 mL/min/1.73   BUN/Creatinine Ratio 23 12 - 28   Sodium 143 134 - 144 mmol/L   Potassium 4.0 3.5 - 5.2 mmol/L   Chloride 104 96 - 106 mmol/L   CO2 21 20 - 29 mmol/L   Calcium 9.1 8.7 - 10.3 mg/dL   Total Protein 7.1 6.0 - 8.5 g/dL   Albumin 4.6 3.8 - 4.8 g/dL   Globulin, Total 2.5 1.5 - 4.5 g/dL   Albumin/Globulin Ratio 1.8 1.2 - 2.2   Bilirubin Total 0.3 0.0 - 1.2 mg/dL   Alkaline Phosphatase 96 44 - 121 IU/L   AST 10 0 - 40 IU/L   ALT 10 0 - 32 IU/L  Lipid Profile  Result Value Ref Range   Cholesterol, Total 177 100 - 199 mg/dL   Triglycerides 38 0 - 149 mg/dL   HDL 83 >39 mg/dL   VLDL Cholesterol Cal 8 5 - 40 mg/dL   LDL Chol Calc (NIH) 86 0 - 99 mg/dL   Chol/HDL Ratio 2.1 0.0 - 4.4 ratio      Assessment & Plan:   Problem List Items Addressed This Visit       Cardiovascular and Mediastinum   Primary hypertension     Other   Hypercholesteremia - Primary     Follow up plan: No follow-ups on file.

## 2022-02-11 ENCOUNTER — Observation Stay
Admission: EM | Admit: 2022-02-11 | Discharge: 2022-02-13 | Disposition: A | Discharge status: Home Health | Payer: Medicaid Other | Attending: Internal Medicine | Admitting: Internal Medicine

## 2022-02-11 ENCOUNTER — Emergency Department: Payer: Medicaid Other

## 2022-02-11 DIAGNOSIS — S0003XA Contusion of scalp, initial encounter: Principal | ICD-10-CM | POA: Insufficient documentation

## 2022-02-11 DIAGNOSIS — K219 Gastro-esophageal reflux disease without esophagitis: Secondary | ICD-10-CM

## 2022-02-11 DIAGNOSIS — W19XXXA Unspecified fall, initial encounter: Secondary | ICD-10-CM | POA: Diagnosis not present

## 2022-02-11 DIAGNOSIS — I10 Essential (primary) hypertension: Secondary | ICD-10-CM | POA: Insufficient documentation

## 2022-02-11 DIAGNOSIS — Z23 Encounter for immunization: Secondary | ICD-10-CM | POA: Diagnosis not present

## 2022-02-11 DIAGNOSIS — N179 Acute kidney failure, unspecified: Secondary | ICD-10-CM | POA: Insufficient documentation

## 2022-02-11 DIAGNOSIS — I959 Hypotension, unspecified: Secondary | ICD-10-CM | POA: Diagnosis not present

## 2022-02-11 DIAGNOSIS — Z79899 Other long term (current) drug therapy: Secondary | ICD-10-CM | POA: Insufficient documentation

## 2022-02-11 DIAGNOSIS — E785 Hyperlipidemia, unspecified: Secondary | ICD-10-CM

## 2022-02-11 DIAGNOSIS — Y92009 Unspecified place in unspecified non-institutional (private) residence as the place of occurrence of the external cause: Secondary | ICD-10-CM | POA: Diagnosis not present

## 2022-02-11 DIAGNOSIS — E876 Hypokalemia: Secondary | ICD-10-CM | POA: Diagnosis not present

## 2022-02-11 DIAGNOSIS — W109XXA Fall (on) (from) unspecified stairs and steps, initial encounter: Secondary | ICD-10-CM | POA: Diagnosis not present

## 2022-02-11 DIAGNOSIS — S0990XA Unspecified injury of head, initial encounter: Secondary | ICD-10-CM | POA: Insufficient documentation

## 2022-02-11 DIAGNOSIS — Z96612 Presence of left artificial shoulder joint: Secondary | ICD-10-CM | POA: Diagnosis not present

## 2022-02-11 DIAGNOSIS — R42 Dizziness and giddiness: Secondary | ICD-10-CM | POA: Insufficient documentation

## 2022-02-11 DIAGNOSIS — F17210 Nicotine dependence, cigarettes, uncomplicated: Secondary | ICD-10-CM | POA: Diagnosis not present

## 2022-02-11 DIAGNOSIS — R531 Weakness: Secondary | ICD-10-CM

## 2022-02-11 LAB — COMPREHENSIVE METABOLIC PANEL
ALT: 14 U/L (ref 0–44)
AST: 17 U/L (ref 15–41)
Albumin: 3.7 g/dL (ref 3.5–5.0)
Alkaline Phosphatase: 73 U/L (ref 38–126)
Anion gap: 8 (ref 5–15)
BUN: 29 mg/dL — ABNORMAL HIGH (ref 8–23)
CO2: 26 mmol/L (ref 22–32)
Calcium: 8.4 mg/dL — ABNORMAL LOW (ref 8.9–10.3)
Chloride: 104 mmol/L (ref 98–111)
Creatinine, Ser: 1.13 mg/dL — ABNORMAL HIGH (ref 0.44–1.00)
GFR, Estimated: 54 mL/min — ABNORMAL LOW (ref >60)
Glucose, Bld: 128 mg/dL — ABNORMAL HIGH (ref 70–99)
Potassium: 3.3 mmol/L — ABNORMAL LOW (ref 3.5–5.1)
Sodium: 138 mmol/L (ref 135–145)
Total Bilirubin: 0.2 mg/dL — ABNORMAL LOW (ref 0.3–1.2)
Total Protein: 6.7 g/dL (ref 6.5–8.1)

## 2022-02-11 LAB — CBC WITH DIFFERENTIAL/PLATELET
Abs Immature Granulocytes: 0.03 10*3/uL (ref 0.00–0.07)
Basophils Absolute: 0 10*3/uL (ref 0.0–0.1)
Basophils Relative: 0 %
Eosinophils Absolute: 0.1 10*3/uL (ref 0.0–0.5)
Eosinophils Relative: 1 %
HCT: 34.4 % — ABNORMAL LOW (ref 36.0–46.0)
Hemoglobin: 10.6 g/dL — ABNORMAL LOW (ref 12.0–15.0)
Immature Granulocytes: 0 %
Lymphocytes Relative: 16 %
Lymphs Abs: 1.2 10*3/uL (ref 0.7–4.0)
MCH: 23.9 pg — ABNORMAL LOW (ref 26.0–34.0)
MCHC: 30.8 g/dL (ref 30.0–36.0)
MCV: 77.5 fL — ABNORMAL LOW (ref 80.0–100.0)
Monocytes Absolute: 0.6 10*3/uL (ref 0.1–1.0)
Monocytes Relative: 8 %
Neutro Abs: 5.6 10*3/uL (ref 1.7–7.7)
Neutrophils Relative %: 75 %
Platelets: 349 10*3/uL (ref 150–400)
RBC: 4.44 MIL/uL (ref 3.87–5.11)
RDW: 17.7 % — ABNORMAL HIGH (ref 11.5–15.5)
WBC: 7.6 10*3/uL (ref 4.0–10.5)
nRBC: 0 % (ref 0.0–0.2)

## 2022-02-11 LAB — TROPONIN I (HIGH SENSITIVITY): Troponin I (High Sensitivity): 8 ng/L (ref <18)

## 2022-02-11 MED ORDER — ACETAMINOPHEN 325 MG PO TABS
650.0000 mg | ORAL_TABLET | Freq: Four times a day (QID) | ORAL | Status: DC | PRN
  Administered 2022-02-12 – 2022-02-13 (×2): 650 mg via ORAL
  Filled 2022-02-11 (×2): qty 2

## 2022-02-11 MED ORDER — AMLODIPINE BESYLATE 10 MG PO TABS: 10.0000 mg | ORAL_TABLET | Freq: Every day | ORAL | Status: DC

## 2022-02-11 MED ORDER — TRAZODONE HCL 50 MG PO TABS
25.0000 mg | ORAL_TABLET | Freq: Every evening | ORAL | Status: DC | PRN
Start: 2022-02-11 — End: 2022-02-12

## 2022-02-11 MED ORDER — ACETAMINOPHEN 650 MG RE SUPP: 650.0000 mg | Freq: Four times a day (QID) | RECTAL | Status: DC | PRN

## 2022-02-11 MED ORDER — SODIUM CHLORIDE 0.9 % IV SOLN: INTRAVENOUS | Status: DC

## 2022-02-11 MED ORDER — MAGNESIUM HYDROXIDE 400 MG/5ML PO SUSP: 30.0000 mL | Freq: Every day | ORAL | Status: DC | PRN

## 2022-02-11 MED ORDER — ONDANSETRON HCL 4 MG PO TABS: 4.0000 mg | ORAL_TABLET | Freq: Four times a day (QID) | ORAL | Status: DC | PRN

## 2022-02-11 MED ORDER — ENOXAPARIN SODIUM 40 MG/0.4ML IJ SOSY
40.0000 mg | PREFILLED_SYRINGE | INTRAMUSCULAR | Status: DC
  Administered 2022-02-12 – 2022-02-13 (×2): 40 mg via SUBCUTANEOUS
  Filled 2022-02-11 (×2): qty 0.4

## 2022-02-11 MED ORDER — ONDANSETRON HCL 4 MG/2ML IJ SOLN: 4.0000 mg | Freq: Four times a day (QID) | INTRAMUSCULAR | Status: DC | PRN

## 2022-02-11 NOTE — ED Triage Notes (Signed)
Pt presents via EMS with complaints of a fall this afternoon at home hitting the back of her head. No LOC - not on thinners. 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 +orthostatics with EMS. Per family, pt has had increased weakness since the procedure. Denies CP or SOB.    VS with EMS 97% RA 69 HR 11/48 CBG 142 98.1 Oral  20G RAC

## 2022-02-11 NOTE — ED Notes (Signed)
Pt is A&Ox4. Pt fell after having surgery on 8/21. Pt hit head, no LOC and no blood thinners. Pt unable to move either arm or neck and requires help with both. Pt took nothing before coming to the hospital for pain and had refused transport after it initially occurred.

## 2022-02-11 NOTE — H&P (Signed)
[place]   PATIENT NAME: Sue Richardson    MR#:  [id number]  DATE OF BIRTH:  [birth year]  DATE OF ADMISSION:  02/11/2022  PRIMARY CARE PHYSICIAN: System, Provider Not In   Patient is coming from: Home  REQUESTING/REFERRING PHYSICIAN: Miki Kins, MD  CHIEF COMPLAINT:   Chief Complaint  Patient presents with   Fall    HISTORY OF PRESENT ILLNESS:  Sue Richardson is a 66 y.o. 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  She had a fall while coming in from her porch when she lost her balance on a step and fell backwards hitting her head.  She denied any presyncope or syncope.  She had mild neck pain with no back pain.  Her generalized weakness that has started after her surgery in July has gotten significantly worse over the last few days.  She was recently started on a pain medication per him on Tuesday.  No dysuria, oliguria or hematuria or flank pain.  No cough or wheezing or dyspnea.  No chest pain or palpitations.  No paresthesias or focal muscle weakness.  No tinnitus or vertigo.  No weakness seizures.  ED Course: When she came to the ER vital signs were within normal.  Labs reveal hypokalemia 3.3 and a BUN of 29 now with a creatinine 1.13 above previous levels in October of last year.  CBC showed anemia compared to previous levels and UA came back negative.  EKG as reviewed by me : Pending Imaging:  Left shoulder x-ray showed her left shoulder replacement without significant abnormality.  T-spine CT showed no traumatic injury lumbar spine CT showed mild degenerative changes with no acute injury.  C-spine CT showed no acute abnormalities and noncontrasted CT scan revealed 2 small extracranial hematomas overlying the  left occipital area with no acute findings otherwise.  The patient will be admitted to an observation medical telemetry bed for further evaluation and management. PAST MEDICAL HISTORY:   Past Medical History:  Diagnosis Date   Hypertension     PAST SURGICAL HISTORY:   Past Surgical History:  Procedure Laterality Date   TOTAL ABDOMINAL HYSTERECTOMY W/ BILATERAL SALPINGOOPHORECTOMY      SOCIAL HISTORY:   Social History   Tobacco Use   Smoking status: Every Day    Packs/day: 0.25    Types: Cigarettes   Smokeless tobacco: Never  Substance Use Topics   Alcohol use: No    FAMILY HISTORY:   Family History  Problem Relation Age of Onset   COPD Sister    Hypertension Sister    Stroke Sister    Breast cancer Neg Hx     DRUG ALLERGIES:  No Known Allergies  REVIEW OF SYSTEMS:   ROS As per history of present illness. All pertinent systems were reviewed above. Constitutional, HEENT, cardiovascular, respiratory, GI, GU, musculoskeletal, neuro, psychiatric, endocrine, integumentary and hematologic systems were reviewed and are otherwise negative/unremarkable except for positive findings mentioned above in the HPI.   MEDICATIONS AT HOME:   Prior to Admission medications   Medication Sig Start Date End Date Taking? Authorizing Provider  amLODipine (NORVASC) 10 MG tablet Take 1 tablet (10 mg total) by mouth daily. 12/24/15 01/24/16  Darci Current, MD  atorvastatin (LIPITOR) 20 MG tablet Take 1 tablet (20 mg  total) by mouth daily. 04/26/21   Larae Grooms, NP      VITAL SIGNS:  Blood pressure 120/67, pulse 72, temperature 97.8 F (36.6 C), temperature source Oral, resp. rate 18, height 5\' 4"  (1.626 m), weight 78.9 kg, SpO2 100 %.  PHYSICAL EXAMINATION:  Physical Exam  GENERAL:  66 y.o.-year-old patient lying in the bed with no acute distress. She was fairly somnolent but arousable. EYES: Pupils equal, round, reactive to light and accommodation. No scleral icterus.  Extraocular muscles intact.  HEENT: Head atraumatic, normocephalic. Oropharynx and nasopharynx clear.  NECK:  Supple, no jugular venous distention. No thyroid enlargement, no tenderness.  LUNGS: Normal breath sounds bilaterally, no wheezing, rales,rhonchi or crepitation. No use of accessory muscles of respiration.  CARDIOVASCULAR: Regular rate and rhythm, S1, S2 normal. No murmurs, rubs, or gallops.  ABDOMEN: Soft, nondistended, nontender. Bowel sounds present. No organomegaly or mass.  EXTREMITIES: No pedal edema, cyanosis, or clubbing.  NEUROLOGIC: Cranial nerves II through XII are intact. Muscle strength 5/5 in all extremities. Sensation intact. Gait not checked.  PSYCHIATRIC: The patient is somnolent but arousable and oriented x 3.  Normal affect and good eye contact. SKIN: No obvious rash, lesion, or ulcer.   LABORATORY PANEL:   CBC Recent Labs  Lab 02/11/22 2216  WBC 7.6  HGB 10.6*  HCT 34.4*  PLT 349   ------------------------------------------------------------------------------------------------------------------  Chemistries  Recent Labs  Lab 02/11/22 2216  NA 138  K 3.3*  CL 104  CO2 26  GLUCOSE 128*  BUN 29*  CREATININE 1.13*  CALCIUM 8.4*  AST 17  ALT 14  ALKPHOS 73  BILITOT 0.2*   ------------------------------------------------------------------------------------------------------------------  Cardiac Enzymes No results for input(s): "TROPONINI" in the last 168 hours. ------------------------------------------------------------------------------------------------------------------  RADIOLOGY:  DG Shoulder Left  Result Date: 02/11/2022 CLINICAL DATA:  Shoulder pain after fall, recent surgery EXAM: LEFT SHOULDER - 2+ VIEW COMPARISON:  None Available. FINDINGS: AC joint appears intact. Left proximal humerus prosthesis. Metallic plate along the glenoid, not completely flush with the surface of the bone on AP view. No definitive fracture is seen. Possible  small amount of periosteal new bone formation at the inferior cortical margin of proximal humerus which may be due to healing postsurgical change or healing of previous fracture. The left apex is clear IMPRESSION: Left shoulder replacement. No acute fracture seen. Small metallic plate in the region of the glenoid, appears separated from cortical surface of the bone on AP view, correlation with previous postoperative images is recommended to assess for change in alignment or potential hardware complication. Electronically Signed   By: 04/13/2022 M.D.   On: 02/11/2022 23:57   CT Thoracic Spine Wo Contrast  Result Date: 02/11/2022 CLINICAL DATA:  Trauma/fall EXAM: CT THORACIC SPINE WITHOUT CONTRAST TECHNIQUE: Multidetector CT images of the thoracic were obtained using the standard protocol without intravenous contrast. RADIATION DOSE REDUCTION: This exam was performed according to the departmental dose-optimization program which includes automated exposure control, adjustment of the mA and/or kV according to patient size and/or use of iterative reconstruction technique. COMPARISON:  None Available. FINDINGS: Alignment: Normal thoracic kyphosis. Vertebrae: No acute fracture or focal pathologic process. Paraspinal and other soft tissues: Vascular calcifications. No suspicious pulmonary nodules. Disc levels: Mild degenerative changes at T12-L1. Spinal canal is patent. IMPRESSION: No traumatic injury to the thoracic spine. Electronically Signed   By: 04/13/2022 M.D.   On: 02/11/2022 22:33   CT Lumbar Spine Wo Contrast  Result Date: 02/11/2022 CLINICAL DATA:  Trauma/fall EXAM: CT  LUMBAR SPINE WITHOUT CONTRAST TECHNIQUE: Multidetector CT imaging of the lumbar spine was performed without intravenous contrast administration. Multiplanar CT image reconstructions were also generated. RADIATION DOSE REDUCTION: This exam was performed according to the departmental dose-optimization program which includes automated  exposure control, adjustment of the mA and/or kV according to patient size and/or use of iterative reconstruction technique. COMPARISON:  CT abdomen/pelvis dated 05/15/2010 FINDINGS: Segmentation: 5 lumbar type vertebral bodies. Alignment: Normal lumbar lordosis. Vertebrae: No acute fracture or focal pathologic process. Paraspinal and other soft tissues: Vascular calcifications. Status post hysterectomy. No adnexal masses. Disc levels: Mild degenerative changes at T12-L1, L4-5, and L5-S1. Spinal canal is patent. IMPRESSION: No traumatic injury to the lumbar spine. Mild degenerative changes. Electronically Signed   By: Charline Bills M.D.   On: 02/11/2022 22:31   CT Head Wo Contrast  Result Date: 02/11/2022 CLINICAL DATA:  Trauma/fall EXAM: CT HEAD WITHOUT CONTRAST CT CERVICAL SPINE WITHOUT CONTRAST TECHNIQUE: Multidetector CT imaging of the head and cervical spine was performed following the standard protocol without intravenous contrast. Multiplanar CT image reconstructions of the cervical spine were also generated. RADIATION DOSE REDUCTION: This exam was performed according to the departmental dose-optimization program which includes automated exposure control, adjustment of the mA and/or kV according to patient size and/or use of iterative reconstruction technique. COMPARISON:  CT head dated 12/24/2015 FINDINGS: CT HEAD FINDINGS Brain: No evidence of acute infarction, hemorrhage, hydrocephalus, extra-axial collection or mass lesion/mass effect. Vascular: Mild intracranial atherosclerosis. Skull: Normal. Negative for fracture or focal lesion. Sinuses/Orbits: The visualized paranasal sinuses are essentially clear. The mastoid air cells are unopacified. Other: Two small extracranial hematomas overlying the left occipital bone (series 2/image 17). CT CERVICAL SPINE FINDINGS Alignment: Reversal the normal cervical lordosis, likely positional. Skull base and vertebrae: No acute fracture. No primary bone lesion or  focal pathologic process. Soft tissues and spinal canal: No prevertebral fluid or swelling. No visible canal hematoma. Disc levels: Mild degenerative changes of the mid/lower cervical spine. Spinal canal is patent. Upper chest: Visualized lung apices are clear. Other: Visualized thyroid is unremarkable. IMPRESSION: Two small extracranial hematomas overlying the left occipital bone. No evidence of calvarial fracture. No evidence of acute intracranial abnormality. No evidence of traumatic injury to the cervical spine. Mild degenerative changes. Electronically Signed   By: Charline Bills M.D.   On: 02/11/2022 22:29   CT Cervical Spine Wo Contrast  Result Date: 02/11/2022 CLINICAL DATA:  Trauma/fall EXAM: CT HEAD WITHOUT CONTRAST CT CERVICAL SPINE WITHOUT CONTRAST TECHNIQUE: Multidetector CT imaging of the head and cervical spine was performed following the standard protocol without intravenous contrast. Multiplanar CT image reconstructions of the cervical spine were also generated. RADIATION DOSE REDUCTION: This exam was performed according to the departmental dose-optimization program which includes automated exposure control, adjustment of the mA and/or kV according to patient size and/or use of iterative reconstruction technique. COMPARISON:  CT head dated 12/24/2015 FINDINGS: CT HEAD FINDINGS Brain: No evidence of acute infarction, hemorrhage, hydrocephalus, extra-axial collection or mass lesion/mass effect. Vascular: Mild intracranial atherosclerosis. Skull: Normal. Negative for fracture or focal lesion. Sinuses/Orbits: The visualized paranasal sinuses are essentially clear. The mastoid air cells are unopacified. Other: Two small extracranial hematomas overlying the left occipital bone (series 2/image 17). CT CERVICAL SPINE FINDINGS Alignment: Reversal the normal cervical lordosis, likely positional. Skull base and vertebrae: No acute fracture. No primary bone lesion or focal pathologic process. Soft tissues  and spinal canal: No prevertebral fluid or swelling. No visible canal hematoma. Disc levels: Mild  degenerative changes of the mid/lower cervical spine. Spinal canal is patent. Upper chest: Visualized lung apices are clear. Other: Visualized thyroid is unremarkable. IMPRESSION: Two small extracranial hematomas overlying the left occipital bone. No evidence of calvarial fracture. No evidence of acute intracranial abnormality. No evidence of traumatic injury to the cervical spine. Mild degenerative changes. Electronically Signed   By: Charline Bills M.D.   On: 02/11/2022 22:29      IMPRESSION AND PLAN:  Assessment and Plan: * Generalized weakness - This associated with altered mental status with somnolence and subsequent fall with head injury and occipital hematoma. - This could be related to a volume depletion and dehydration with subsequent AKI as well as recent pain medication given. - She will be admitted to an observation medical telemetry bed. - PT consult will be obtained. - Management otherwise as below. - We will hold off narcotic therapy as much as we can.  AKI (acute kidney injury) (HCC) - This is likely prerenal due to volume  depletion and dehydration. - We will hydrate with IV normal saline and follow BMP.  Hypokalemia Potassium be replaced and magnesium level will be checked.  GERD (gastroesophageal reflux disease) - We will continue PPI therapy.  Dyslipidemia Therapy.- We will continue statin therapy  Essential hypertension - We will continue her antihypertensives.    DVT prophylaxis: Lovenox.  Advanced Care Planning:  Code Status: full code.  Family Communication:  The plan of care was discussed in details with the patient (and family). I answered all questions. The patient agreed to proceed with the above mentioned plan. Further management will depend upon hospital course. Disposition Plan: Back to previous home environment Consults called: none.  All the  records are reviewed and case discussed with ED provider.  Status is: Observation  I certify that at the time of admission, it is my clinical judgment that the patient will require  hospital care extending less than 2 midnights.                            Dispo: The patient is from: Home              Anticipated d/c is to: Home              Patient currently is not medically stable to d/c.              Difficult to place patient: No  Hannah Beat M.D on 02/12/2022 at 4:21 AM  [facility]   From 7 PM-7 AM, contact night-coverage [email or web address]  CC: Primary care physician; System, Provider Not In

## 2022-02-11 NOTE — ED Provider Notes (Signed)
[facility] Provider Note    Event Date/Time   First MD Initiated Contact with Patient 02/11/22 2053     (approximate)   History   Fall   HPI  Aubriel Khanna is a 66 y.o. female with history of hypertension, hyperlipidemia, and GERD as well as left total shoulder arthroplasty in July who presents with generalized weakness and a fall.  The patient reports that today she was coming in from the porch when she lost her balance on a step and fell backwards hitting her head.  She denies loss of consciousness.  She does report some neck pain but no back pain.  Her son reports that she has been generally weak since leaving the hospital after the surgery and this has increased somewhat in the last few days.  She is on pain medication which was recently changed.     Physical Exam   Triage Vital Signs: ED Triage Vitals [02/11/22 2049]  Enc Vitals Group     BP 132/74     Pulse Rate 77     Resp 18     Temp 97.9 F (36.6 C)     Temp Source Oral     SpO2 100 %     Weight 174 lb (78.9 kg)     Height 5\' 4"  (1.626 m)     Head Circumference      Peak Flow      Pain Score 6     Pain Loc      Pain Edu?      Excl. in GC?     Most recent vital signs: Vitals:   02/11/22 2049  BP: 132/74  Pulse: 77  Resp: 18  Temp: 97.9 F (36.6 C)  SpO2: 100%     General: Sleepy appearing but arousable, weak, no acute distress. CV:  Good peripheral perfusion.  Resp:  Normal effort.  Abd:  No distention.  Other:  Mild cervical midline tenderness with no step-off or crepitus.  3 cm hematoma to the back of the head.  EOMI.  PERRLA.  Motor intact in all extremities.   ED Results / Procedures / Treatments   Labs (all labs ordered are listed, but only abnormal results are displayed) Labs Reviewed  COMPREHENSIVE METABOLIC PANEL - Abnormal; Notable for the following components:      Result Value   Potassium 3.3 (*)    Glucose, Bld 128 (*)    BUN 29 (*)     Creatinine, Ser 1.13 (*)    Calcium 8.4 (*)    Total Bilirubin 0.2 (*)    GFR, Estimated 54 (*)    All other components within normal limits  CBC WITH DIFFERENTIAL/PLATELET - Abnormal; Notable for the following components:   Hemoglobin 10.6 (*)    HCT 34.4 (*)    MCV 77.5 (*)    MCH 23.9 (*)    RDW 17.7 (*)    All other components within normal limits  URINALYSIS, ROUTINE W REFLEX MICROSCOPIC  TROPONIN I (HIGH SENSITIVITY)  TROPONIN I (HIGH SENSITIVITY)     EKG    RADIOLOGY  CT head: I independently viewed and interpreted the images; there is no ICH or other acute intracranial finding.  CT cervical spine: No acute fracture  CT thoracic spine: No acute fracture  CT lumbar spine: No acute fracture  XR L shoulder: Pending  PROCEDURES:  Critical Care performed: No  Procedures   MEDICATIONS ORDERED IN ED: Medications - No data to display  IMPRESSION / MDM / ASSESSMENT AND PLAN / ED COURSE  I reviewed the triage vital signs and the nursing notes.  66 year old female with PMH as noted above presents with a fall and generalized weakness.  I reviewed the past medical records.  The patient was admitted at [facility] in July for a left shoulder arthroplasty.  She most recently followed up with the surgeon on 02/08/2022 and was put on diclofenac.  The son reports that she had been on tramadol but currently is on gabapentin as well as the diclofenac.  On her dam the vital signs are normal.  Neurologic exam is nonfocal although the patient is somewhat somnolent and weak appearing.  Differential diagnosis includes, but is not limited to, dehydration, electrolyte abnormality, AKI, UTI or other infection, medication side effect.  I have a low suspicion for cardiac or CNS etiology.  Head injury is consistent with minor head injury rather than concussion or intracranial hemorrhage.  We will obtain CT of the head and spine to rule out traumatic findings as well as x-rays of the left  shoulder.  Patient's presentation is most consistent with acute complicated illness / injury requiring diagnostic workup.  ----------------------------------------- 11:43 PM on 02/11/2022 -----------------------------------------  Basic labs are unremarkable.  Urinalysis is pending.  CT head and CTs of the spine show no acute traumatic findings other than a posterior scalp hematoma.  Given the generalized weakness I feel that the patient would benefit from additional observation.  I consulted Dr. Jarome Lamas from the hospitalist service; based on her discussion he agrees to admit the patient.    FINAL CLINICAL IMPRESSION(S) / ED DIAGNOSES   Final diagnoses:  Fall, initial encounter  Injury of head, initial encounter  Generalized weakness     Rx / DC Orders   ED Discharge Orders     None        Note:  This document was prepared using Dragon voice recognition software and may include unintentional dictation errors.    Dionne Bucy, MD 02/11/22 [phone]

## 2022-02-12 ENCOUNTER — Encounter: Payer: Self-pay | Admitting: Family Medicine

## 2022-02-12 ENCOUNTER — Other Ambulatory Visit: Payer: Self-pay

## 2022-02-12 DIAGNOSIS — R531 Weakness: Secondary | ICD-10-CM | POA: Diagnosis not present

## 2022-02-12 DIAGNOSIS — I10 Essential (primary) hypertension: Secondary | ICD-10-CM

## 2022-02-12 DIAGNOSIS — N179 Acute kidney failure, unspecified: Secondary | ICD-10-CM | POA: Diagnosis not present

## 2022-02-12 DIAGNOSIS — E785 Hyperlipidemia, unspecified: Secondary | ICD-10-CM

## 2022-02-12 DIAGNOSIS — W19XXXS Unspecified fall, sequela: Secondary | ICD-10-CM | POA: Diagnosis not present

## 2022-02-12 DIAGNOSIS — W19XXXA Unspecified fall, initial encounter: Secondary | ICD-10-CM

## 2022-02-12 DIAGNOSIS — E876 Hypokalemia: Secondary | ICD-10-CM

## 2022-02-12 DIAGNOSIS — K219 Gastro-esophageal reflux disease without esophagitis: Secondary | ICD-10-CM

## 2022-02-12 LAB — BASIC METABOLIC PANEL
Anion gap: 7 (ref 5–15)
BUN: 23 mg/dL (ref 8–23)
CO2: 27 mmol/L (ref 22–32)
Calcium: 8.5 mg/dL — ABNORMAL LOW (ref 8.9–10.3)
Chloride: 106 mmol/L (ref 98–111)
Creatinine, Ser: 0.8 mg/dL (ref 0.44–1.00)
GFR, Estimated: >60 mL/min (ref >60)
Glucose, Bld: 103 mg/dL — ABNORMAL HIGH (ref 70–99)
Potassium: 3.9 mmol/L (ref 3.5–5.1)
Sodium: 140 mmol/L (ref 135–145)

## 2022-02-12 LAB — URINALYSIS, ROUTINE W REFLEX MICROSCOPIC
Bilirubin Urine: NEGATIVE
Glucose, UA: NEGATIVE mg/dL
Hgb urine dipstick: NEGATIVE
Ketones, ur: NEGATIVE mg/dL
Leukocytes,Ua: NEGATIVE
Nitrite: NEGATIVE
Protein, ur: NEGATIVE mg/dL
Specific Gravity, Urine: 1.012 (ref 1.005–1.030)
pH: 5 (ref 5.0–8.0)

## 2022-02-12 LAB — CBC
HCT: 31.8 % — ABNORMAL LOW (ref 36.0–46.0)
Hemoglobin: 9.9 g/dL — ABNORMAL LOW (ref 12.0–15.0)
MCH: 23.7 pg — ABNORMAL LOW (ref 26.0–34.0)
MCHC: 31.1 g/dL (ref 30.0–36.0)
MCV: 76.3 fL — ABNORMAL LOW (ref 80.0–100.0)
Platelets: 331 10*3/uL (ref 150–400)
RBC: 4.17 MIL/uL (ref 3.87–5.11)
RDW: 17.8 % — ABNORMAL HIGH (ref 11.5–15.5)
WBC: 5.1 10*3/uL (ref 4.0–10.5)
nRBC: 0 % (ref 0.0–0.2)

## 2022-02-12 LAB — TROPONIN I (HIGH SENSITIVITY): Troponin I (High Sensitivity): 7 ng/L (ref <18)

## 2022-02-12 LAB — MAGNESIUM: Magnesium: 2.2 mg/dL (ref 1.7–2.4)

## 2022-02-12 MED ORDER — TRAMADOL HCL 50 MG PO TABS
50.0000 mg | ORAL_TABLET | Freq: Four times a day (QID) | ORAL | Status: DC | PRN
  Administered 2022-02-12: 100 mg via ORAL
  Filled 2022-02-12: qty 2

## 2022-02-12 MED ORDER — TRAMADOL HCL 50 MG PO TABS
50.0000 mg | ORAL_TABLET | Freq: Four times a day (QID) | ORAL | Status: DC | PRN
  Administered 2022-02-12 (×2): 50 mg via ORAL
  Filled 2022-02-12 (×2): qty 1

## 2022-02-12 MED ORDER — INFLUENZA VAC SPLIT QUAD 0.5 ML IM SUSY
0.5000 mL | PREFILLED_SYRINGE | INTRAMUSCULAR | Status: AC
Start: 2022-02-13 — End: 2022-02-13
  Administered 2022-02-13: 0.5 mL via INTRAMUSCULAR
  Filled 2022-02-12: qty 0.5

## 2022-02-12 MED ORDER — FERROUS SULFATE 325 (65 FE) MG PO TABS
325.0000 mg | ORAL_TABLET | ORAL | Status: DC
  Administered 2022-02-12: 325 mg via ORAL
  Filled 2022-02-12: qty 1

## 2022-02-12 MED ORDER — ORAL CARE MOUTH RINSE: 15.0000 mL | OROMUCOSAL | Status: DC | PRN

## 2022-02-12 MED ORDER — POTASSIUM CHLORIDE 20 MEQ PO PACK
40.0000 meq | PACK | Freq: Once | ORAL | Status: AC
  Administered 2022-02-12: 40 meq via ORAL
  Filled 2022-02-12: qty 2

## 2022-02-12 MED ORDER — MECLIZINE HCL 25 MG PO TABS
12.5000 mg | ORAL_TABLET | Freq: Three times a day (TID) | ORAL | Status: DC | PRN
  Administered 2022-02-12: 12.5 mg via ORAL
  Filled 2022-02-12 (×2): qty 0.5

## 2022-02-12 MED ORDER — GABAPENTIN 600 MG PO TABS
600.0000 mg | ORAL_TABLET | Freq: Every day | ORAL | Status: DC
  Administered 2022-02-12: 600 mg via ORAL
  Filled 2022-02-12: qty 1

## 2022-02-12 MED ORDER — PANTOPRAZOLE SODIUM 40 MG PO TBEC
40.0000 mg | DELAYED_RELEASE_TABLET | Freq: Every day | ORAL | Status: DC
  Administered 2022-02-12 – 2022-02-13 (×2): 40 mg via ORAL
  Filled 2022-02-12 (×2): qty 1

## 2022-02-12 NOTE — Progress Notes (Signed)
Pt admitted from the ED. A&Ox4, RA, VSS, denies pain. Patient is able to get up with 1+ assists, but requested for purewick d/t incontinence. She is pleasant and cooperative. Given water, gingerale, and welcome tray meal. Son at bedside.

## 2022-02-12 NOTE — Evaluation (Signed)
Physical Therapy Evaluation Patient Details Name: Sue Richardson MRN: [id number] DOB: [birth year] Today's Date: 02/12/2022  History of Present Illness  Pt is a 66 y/o F admitted on 02/11/22 after presenting with c/o acute onset of AMS with generalized weakness & excessive sleepiness. Pt also endorses falling backwards & hitting her head. PMH: GERD, dyslipidemia, HTN, OA, L TSA July 2023  Clinical Impression  Pt seen for PT evaluation with pt agreeable to tx. Pt reports she has been receiving HHPT services since her L shoulder sx in July. Pt reports she always has someone assist her with transfers & she ambulates with an AD (describes it as a 3 pronged walker she can hold with 1 hand). Pt reports she was recently cleared to not use LUE sling but should not use UE during mobility. On this date, pt requires extra time & rocking to gain momentum for successful STS from recliner, pushing to stand with LUE.  Pt ambulates with RUE HHA support to simulate AD. Pt is able to ambulate with CGA<>Min assist with shuffled gait pattern that increases her fall risk but pt appears to be at her baseline in regards to functional mobility. Recommend pt d/c home & resume HHPT services.     Recommendations for follow up therapy are one component of a multi-disciplinary discharge planning process, led by the attending physician.  Recommendations may be updated based on patient status, additional functional criteria and insurance authorization.  Follow Up Recommendations Home health PT      Assistance Recommended at Discharge Intermittent Supervision/Assistance  Patient can return home with the following  A little help with walking and/or transfers;A little help with bathing/dressing/bathroom;Help with stairs or ramp for entrance;Assist for transportation;Assistance with cooking/housework    Equipment Recommendations None recommended by PT  Recommendations for Other Services       Functional Status Assessment  Patient has had a recent decline in their functional status and demonstrates the ability to make significant improvements in function in a reasonable and predictable amount of time.     Precautions / Restrictions Precautions Precautions: Fall Restrictions Weight Bearing Restrictions: Yes LUE Weight Bearing: Non weight bearing (per pt following L TSA in July)      Mobility  Bed Mobility               General bed mobility comments: not tested, pt received & left sitting in recliner    Transfers Overall transfer level: Needs assistance Equipment used: None Transfers: Sit to/from Stand Sit to Stand: Supervision           General transfer comment: Pt reports her family assists her at home, reports she has to rock back & forth multiple times but is eventually able to transfer STS with close supervision. cuing to push on armrest.    Ambulation/Gait Ambulation/Gait assistance: Min guard, Min assist Gait Distance (Feet): 35 Feet Assistive device: 1 person hand held assist Gait Pattern/deviations: Decreased step length - left, Decreased dorsiflexion - right, Decreased dorsiflexion - left, Decreased stance time - left, Decreased stride length, Decreased step length - right, Decreased stance time - right, Shuffle Gait velocity: decreased     General Gait Details: Pt with absent heel strike BLE, suffled gait pattern with decreased step & stride length.  Stairs            Wheelchair Mobility    Modified Rankin (Stroke Patients Only)       Balance Overall balance assessment: Needs assistance Sitting-balance support: Feet supported, Bilateral upper extremity  supported Sitting balance-Leahy Scale: Fair     Standing balance support: Single extremity supported, During functional activity Standing balance-Leahy Scale: Fair                               Pertinent Vitals/Pain Pain Assessment Pain Assessment: Faces Faces Pain Scale: Hurts a little  bit Pain Location: posterior B shoulders Pain Descriptors / Indicators: Discomfort Pain Intervention(s): Monitored during session    Home Living Family/patient expects to be discharged to:: Private residence Living Arrangements: Children Available Help at Discharge: Family;Available 24 hours/day Type of Home: House Home Access: Stairs to enter Entrance Stairs-Rails: Right;Left (wideset) Entrance Stairs-Number of Steps: 2   Home Layout: One level   Additional Comments: Pt reports she ambulates with a 3 wheeled walker she uses with 1 hand.    Prior Function               Mobility Comments: Pt reprots her sister & son assist her with all mobility following L shoulder sx in July. Endorses 2 falls in the past 6 months.       Hand Dominance        Extremity/Trunk Assessment   Upper Extremity Assessment Upper Extremity Assessment:  (Pt maintains L elbow flexion & guarded position, arthritis noted in RUE)    Lower Extremity Assessment Lower Extremity Assessment: Generalized weakness       Communication      Cognition Arousal/Alertness: Awake/alert Behavior During Therapy: Flat affect Overall Cognitive Status: Within Functional Limits for tasks assessed                                 General Comments: AxOx4        General Comments      Exercises     Assessment/Plan    PT Assessment Patient needs continued PT services  PT Problem List Decreased strength;Decreased activity tolerance;Decreased balance;Decreased mobility;Decreased safety awareness;Decreased knowledge of use of DME       PT Treatment Interventions DME instruction;Therapeutic exercise;Gait training;Balance training;Stair training;Functional mobility training;Cognitive remediation;Therapeutic activities;Patient/family education;Modalities;Neuromuscular re-education    PT Goals (Current goals can be found in the Care Plan section)  Acute Rehab PT Goals Patient Stated Goal: none  stated PT Goal Formulation: With patient Time For Goal Achievement: 02/26/22 Potential to Achieve Goals: Good    Frequency Min 2X/week     Co-evaluation               AM-PAC PT "6 Clicks" Mobility  Outcome Measure Help needed turning from your back to your side while in a flat bed without using bedrails?: A Little Help needed moving from lying on your back to sitting on the side of a flat bed without using bedrails?: A Little Help needed moving to and from a bed to a chair (including a wheelchair)?: A Little Help needed standing up from a chair using your arms (e.g., wheelchair or bedside chair)?: A Little Help needed to walk in hospital room?: A Little Help needed climbing 3-5 steps with a railing? : A Lot 6 Click Score: 17    End of Session   Activity Tolerance: Patient tolerated treatment well Patient left: in chair;with chair alarm set;with call bell/phone within reach Nurse Communication: Mobility status;Weight bearing status PT Visit Diagnosis: Unsteadiness on feet (R26.81);Muscle weakness (generalized) (M62.81);Difficulty in walking, not elsewhere classified (R26.2)    Time: 9379-0240 PT Time  Calculation (min) (ACUTE ONLY): 13 min   Charges:   PT Evaluation $PT Eval Low Complexity: 1 Low          Aleda Grana, PT, DPT 02/12/22, 12:32 PM   Sandi Mariscal 02/12/2022, 12:29 PM

## 2022-02-12 NOTE — Assessment & Plan Note (Addendum)
-   This associated with altered mental status with somnolence and subsequent fall with head injury and occipital hematoma. - This could be related to a volume depletion and dehydration with subsequent AKI as well as recent pain medication given. - She will be admitted to an observation medical telemetry bed. - PT consult will be obtained. - Management otherwise as below. - We will hold off narcotic therapy as much as we can.

## 2022-02-12 NOTE — Assessment & Plan Note (Signed)
Potassium be replaced and magnesium level will be checked.

## 2022-02-12 NOTE — Assessment & Plan Note (Signed)
-   This is likely prerenal due to volume  depletion and dehydration. - We will hydrate with IV normal saline and follow BMP.

## 2022-02-12 NOTE — Assessment & Plan Note (Signed)
-   We will continue her antihypertensives. 

## 2022-02-12 NOTE — Assessment & Plan Note (Signed)
Therapy.- We will continue statin therapy

## 2022-02-12 NOTE — ED Notes (Signed)
Pt has been falling at home. She started a new pain med Tuesday she has been taking twice a day. Pt is pleasant and has her youngest son with her. Pt is asking for everything to be moved on her including arms and neck.

## 2022-02-12 NOTE — Progress Notes (Signed)
[facility]  - [place] at [facility]   PATIENT NAME: Sue Richardson    MR#:  [id number]  DATE OF BIRTH:  Sep 12, 1955  SUBJECTIVE:   Patient seen earlier. She was hollering with pain. Wanted to set up. But health nurse got her to the chair. Complained of dizziness. Had fallen yesterday. Tells me her left shoulder is hurting. Underwent recent shoulder arthroplasty at [facility] in mid August. According to the son patient fell accidentally from her porch and hit her head. She did not lose consciousness and complained of dizziness but earlier. She worked with physical therapy and ambulated well   VITALS:  Blood pressure 126/72, pulse 84, temperature 97.8 F (36.6 C), resp. rate 17, height 5\' 4"  (1.626 m), weight 78.9 kg, SpO2 100 %.  PHYSICAL EXAMINATION:   GENERAL:  66 y.o.-year-old patient lying in the bed with no acute distress.  LUNGS: Normal breath sounds bilaterally, no wheezing, rales, rhonchi.  CARDIOVASCULAR: S1, S2 normal. No murmurs, rubs, or gallops.  ABDOMEN: Soft, nontender, nondistended. Bowel sounds present.  EXTREMITIES: No  edema b/l. DJD changes. Decreased ROM at left shoulder   NEUROLOGIC: nonfocal  patient is alert and awake SKIN: No obvious rash, lesion, or ulcer.   LABORATORY PANEL:  CBC Recent Labs  Lab 02/12/22 0637  WBC 5.1  HGB 9.9*  HCT 31.8*  PLT 331    Chemistries  Recent Labs  Lab 02/11/22 2216 02/12/22 0637  NA 138 140  K 3.3* 3.9  CL 104 106  CO2 26 27  GLUCOSE 128* 103*  BUN 29* 23  CREATININE 1.13* 0.80  CALCIUM 8.4* 8.5*  MG  --  2.2  AST 17  --   ALT 14  --   ALKPHOS 73  --   BILITOT 0.2*  --    Cardiac Enzymes No results for input(s): "TROPONINI" in the last 168 hours. RADIOLOGY:  DG Shoulder Left  Result Date: 02/11/2022 CLINICAL DATA:  Shoulder pain after fall, recent surgery EXAM: LEFT SHOULDER - 2+ VIEW COMPARISON:  None Available. FINDINGS: AC joint appears intact. Left proximal humerus prosthesis.  Metallic plate along the glenoid, not completely flush with the surface of the bone on AP view. No definitive fracture is seen. Possible small amount of periosteal new bone formation at the inferior cortical margin of proximal humerus which may be due to healing postsurgical change or healing of previous fracture. The left apex is clear IMPRESSION: Left shoulder replacement. No acute fracture seen. Small metallic plate in the region of the glenoid, appears separated from cortical surface of the bone on AP view, correlation with previous postoperative images is recommended to assess for change in alignment or potential hardware complication. Electronically Signed   By: 04/13/2022 M.D.   On: 02/11/2022 23:57   CT Thoracic Spine Wo Contrast  Result Date: 02/11/2022 CLINICAL DATA:  Trauma/fall EXAM: CT THORACIC SPINE WITHOUT CONTRAST TECHNIQUE: Multidetector CT images of the thoracic were obtained using the standard protocol without intravenous contrast. RADIATION DOSE REDUCTION: This exam was performed according to the departmental dose-optimization program which includes automated exposure control, adjustment of the mA and/or kV according to patient size and/or use of iterative reconstruction technique. COMPARISON:  None Available. FINDINGS: Alignment: Normal thoracic kyphosis. Vertebrae: No acute fracture or focal pathologic process. Paraspinal and other soft tissues: Vascular calcifications. No suspicious pulmonary nodules. Disc levels: Mild degenerative changes at T12-L1. Spinal canal is patent. IMPRESSION: No traumatic injury to the thoracic spine. Electronically Signed   By: 04/13/2022  Rito Ehrlich M.D.   On: 02/11/2022 22:33   CT Lumbar Spine Wo Contrast  Result Date: 02/11/2022 CLINICAL DATA:  Trauma/fall EXAM: CT LUMBAR SPINE WITHOUT CONTRAST TECHNIQUE: Multidetector CT imaging of the lumbar spine was performed without intravenous contrast administration. Multiplanar CT image reconstructions were also  generated. RADIATION DOSE REDUCTION: This exam was performed according to the departmental dose-optimization program which includes automated exposure control, adjustment of the mA and/or kV according to patient size and/or use of iterative reconstruction technique. COMPARISON:  CT abdomen/pelvis dated 05/15/2010 FINDINGS: Segmentation: 5 lumbar type vertebral bodies. Alignment: Normal lumbar lordosis. Vertebrae: No acute fracture or focal pathologic process. Paraspinal and other soft tissues: Vascular calcifications. Status post hysterectomy. No adnexal masses. Disc levels: Mild degenerative changes at T12-L1, L4-5, and L5-S1. Spinal canal is patent. IMPRESSION: No traumatic injury to the lumbar spine. Mild degenerative changes. Electronically Signed   By: Charline Bills M.D.   On: 02/11/2022 22:31   CT Head Wo Contrast  Result Date: 02/11/2022 CLINICAL DATA:  Trauma/fall EXAM: CT HEAD WITHOUT CONTRAST CT CERVICAL SPINE WITHOUT CONTRAST TECHNIQUE: Multidetector CT imaging of the head and cervical spine was performed following the standard protocol without intravenous contrast. Multiplanar CT image reconstructions of the cervical spine were also generated. RADIATION DOSE REDUCTION: This exam was performed according to the departmental dose-optimization program which includes automated exposure control, adjustment of the mA and/or kV according to patient size and/or use of iterative reconstruction technique. COMPARISON:  CT head dated 12/24/2015 FINDINGS: CT HEAD FINDINGS Brain: No evidence of acute infarction, hemorrhage, hydrocephalus, extra-axial collection or mass lesion/mass effect. Vascular: Mild intracranial atherosclerosis. Skull: Normal. Negative for fracture or focal lesion. Sinuses/Orbits: The visualized paranasal sinuses are essentially clear. The mastoid air cells are unopacified. Other: Two small extracranial hematomas overlying the left occipital bone (series 2/image 17). CT CERVICAL SPINE  FINDINGS Alignment: Reversal the normal cervical lordosis, likely positional. Skull base and vertebrae: No acute fracture. No primary bone lesion or focal pathologic process. Soft tissues and spinal canal: No prevertebral fluid or swelling. No visible canal hematoma. Disc levels: Mild degenerative changes of the mid/lower cervical spine. Spinal canal is patent. Upper chest: Visualized lung apices are clear. Other: Visualized thyroid is unremarkable. IMPRESSION: Two small extracranial hematomas overlying the left occipital bone. No evidence of calvarial fracture. No evidence of acute intracranial abnormality. No evidence of traumatic injury to the cervical spine. Mild degenerative changes. Electronically Signed   By: Charline Bills M.D.   On: 02/11/2022 22:29   CT Cervical Spine Wo Contrast  Result Date: 02/11/2022 CLINICAL DATA:  Trauma/fall EXAM: CT HEAD WITHOUT CONTRAST CT CERVICAL SPINE WITHOUT CONTRAST TECHNIQUE: Multidetector CT imaging of the head and cervical spine was performed following the standard protocol without intravenous contrast. Multiplanar CT image reconstructions of the cervical spine were also generated. RADIATION DOSE REDUCTION: This exam was performed according to the departmental dose-optimization program which includes automated exposure control, adjustment of the mA and/or kV according to patient size and/or use of iterative reconstruction technique. COMPARISON:  CT head dated 12/24/2015 FINDINGS: CT HEAD FINDINGS Brain: No evidence of acute infarction, hemorrhage, hydrocephalus, extra-axial collection or mass lesion/mass effect. Vascular: Mild intracranial atherosclerosis. Skull: Normal. Negative for fracture or focal lesion. Sinuses/Orbits: The visualized paranasal sinuses are essentially clear. The mastoid air cells are unopacified. Other: Two small extracranial hematomas overlying the left occipital bone (series 2/image 17). CT CERVICAL SPINE FINDINGS Alignment: Reversal the  normal cervical lordosis, likely positional. Skull base and vertebrae: No acute fracture. No  primary bone lesion or focal pathologic process. Soft tissues and spinal canal: No prevertebral fluid or swelling. No visible canal hematoma. Disc levels: Mild degenerative changes of the mid/lower cervical spine. Spinal canal is patent. Upper chest: Visualized lung apices are clear. Other: Visualized thyroid is unremarkable. IMPRESSION: Two small extracranial hematomas overlying the left occipital bone. No evidence of calvarial fracture. No evidence of acute intracranial abnormality. No evidence of traumatic injury to the cervical spine. Mild degenerative changes. Electronically Signed   By: Charline Bills M.D.   On: 02/11/2022 22:29    Assessment and Plan Cameka Rae is a 66 y.o. 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  She had a fall while coming in from her porch when she lost her balance on a step and fell backwards hitting her head.   Left shoulder x-ray showed her left shoulder replacement without significant abnormality.   T-spine CT showed no traumatic injury  lumbar spine CT showed mild degenerative changes with no acute injury. C-spine CT showed no acute abnormalities  CT head  revealed 2 small extracranial hematomas overlying the left occipital area with no acute findings otherwise.  Acute renal failure suspect combination of BP meds, pain meds and dehydration accidental fall left occipital hematoma post fall -- patient was found to be dehydrated with elevated creatinine 1.31---received IV fluids .8 -- will discontinue IV fluids. Encourage patient to eat and drink well -patient ambulated-with physical therapy. They recommend home  health PT  Dizziness -- PRN meclizine  Left shoulder arthroplasty done at [facility] in July 2023 -- patient recently saw Dr., orthopedic and he is aware of x-ray changes -- plan is to follow-up in one month orthopedic strays and continue PT -prescribed diclofenac- --patient's home med list has Celebrex, Mobic, diclofenac, oxycodone, ultram listed---discussed with patient's son Ivin Booty and will have to tweak her pain meds   Relative hypotension history of hypertension -- holding BP meds today  Hypokalemia --repleted  procedures:none Family communication : son Ivin Booty  Consults : none CODE STATUS: full DVT Prophylaxis : enoxaparin Level of care: Telemetry Medical--d/c--pt in NSR Status is: Observation The patient remains OBS appropriate and will d/c before 2 midnights.    TOTAL TIME TAKING CARE OF THIS PATIENT: 35 minutes.  >50% time spent on counselling and coordination of care  Note: This dictation was prepared with Dragon dictation along with smaller phrase technology. Any transcriptional errors that result from this process are unintentional.  Enedina Finner M.D    [facility]   CC: Primary care physician; System, Provider Not In

## 2022-02-12 NOTE — Assessment & Plan Note (Signed)
-   We will continue PPI therapy 

## 2022-02-13 DIAGNOSIS — W19XXXS Unspecified fall, sequela: Secondary | ICD-10-CM | POA: Diagnosis not present

## 2022-02-13 DIAGNOSIS — S0990XA Unspecified injury of head, initial encounter: Secondary | ICD-10-CM | POA: Diagnosis not present

## 2022-02-13 DIAGNOSIS — N179 Acute kidney failure, unspecified: Secondary | ICD-10-CM | POA: Diagnosis not present

## 2022-02-13 DIAGNOSIS — R531 Weakness: Secondary | ICD-10-CM | POA: Diagnosis not present

## 2022-02-13 LAB — HIV ANTIBODY (ROUTINE TESTING W REFLEX): HIV Screen 4th Generation wRfx: NONREACTIVE

## 2022-02-13 NOTE — Discharge Instructions (Addendum)
Utility Resources   Agency Name: [facility] Agency Address: [address], [place], [place] [address] Phone: [phone] Website: [email or web address] Service(s) Offered: Housing services, self-sufficiency, congregate meal  program, and individual development account program.  Agency Name: [employer] [facility] [place] Address: [address] [place], [place] [address] Phone: [phone] Email: [email or web address] Website: [email or web address] Service(s) Offered: Housing the homeless, feeding the hungry, Financial controller, job and education related services.  Agency Name: [facility] Address: [address], [place], [place] [address] Phone: [phone] Email: [email or web address] Service(s) Offered: Counseling, problem pregnancy, advocacy for Hispanics,  limited emergency financial assistance.  Agency Name: Department of Social Services Address: [address] [place], [place] [address] Phone: [phone] Website: [email or web address] Service(s) Offered: Child support services; child welfare services; SNAPS;  Medicaid; work first family assistance; and aid with fuel,  rent, food and medicine.  Agency Name: [facility] representative Address: [address] [place], [place] [address] Phone: [phone] or [phone] Email: [email or web address] Service(s) Offered: Family services and transient assistance; emergency food,  fuel, clothing, limited furniture, utilities; budget counseling,  general counseling; give a kid a coat; thrift store; Christmas  food and toys. Utility assistance, food pantry, rental  assistance, life sustaining medicine      Food Resources  Agency Name: [facility] Agency Address: [address] [place], [place] [address] Phone: [phone] Website: [email or web address].[email or web address]  Service(s) Offered: Housing services, self-sufficiency, congregate meal   program, weatherization program, Field seismologist program, emergency food assistance,  housing counseling, home ownership program, wheels -towork program. Meals free for 60 and older at various  locations from 9am-1pm, Monday-Friday:  [employer], [address] [place], [phone] [facility], [address], [place] [phone]   [facility], [address]   [place] [phone] [address]   [place], [phone]  Agency Name: [facility] Address: [phone], [place], [place] [address] Phone: [phone] Website: [email or web address] Service(s) Offered: Home delivered hot, frozen, and emergency  meals. Grocery assistance program which matches  volunteers one-on-one with seniors unable to grocery shop  for themselves. Must be 60 years and older; less than 20  hours of in-home aide service, limited or no driving ability;  live alone or with someone with a disability; live in  [place].  Agency Name: [facility]) Address: [address] [place], [place] [address] Phone: [phone] Service(s) Offered: Food is served to shut-ins, homeless, elderly, and low  income people in the community every Saturday (11:30  am-12:30 pm) and Sunday (12:30 pm-1:30pm). Volunteers  also offer help and encouragement in seeking employment,  and spiritual guidance. September 29, 2016 8  Agency Name: Department of Social Services Address: [address] [place], [place] [address] Phone: [phone] Service(s) Offered: Child support services; child welfare services; food stamps;  Medicaid; work first family assistance; and aid with fuel,  rent, food and medicine.  Agency Name: [employer]itian Address: [address], [place], [place] Phone: [phone] Website: [email or web address] Services Offered: Monday  10:00am-12:00, 8:00pm-9:00pm, and Friday  10:00am-12:00. Agency Name: [employer] [facility] [place] Address: [address] [place], [place] [address] Phone: [phone] Website: [email or web address] Service(s) Offered: Serves weekday meals, open from 11:30 am- 1:00 pm., and  6:30-7:30pm, Monday-Wednesday-Friday distributes food  3:30-6pm, Monday-Wednesday-Friday.  Agency Name: [facility] Address: [address] [place], [place] Phone: [phone] Website: [email or web address] Services Offered: Distributes food the 4th  Saturday of the month, starting at  8:00 am Agency Name: [facility] Address: [phone] [address] [place], [place] [address] Phone: [phone] Website: [email or web address] Service(s) Offered: Bread of life, weekly food pantry. Open Wednesdays from  10:00am-noon.  Agency Name: [facility] [employer] Address: [address] [place], [place], [place] Phone: [phone] Services Offered: Distributes food 9am-1pm, Monday-Thursday. Call for details.   Agency Name: [facility] Address: [address] [place], [place] [address] Phone: [phone] Website: [email or web address] Service(s) Offered: Games developer. Call for assistance. Agency Name: [facility] Address: [address], [place], [place] [address] Phone: [phone] Service Offered: Emergency Food Pantry. Call for appointment.  Agency Name: [facility] Address: [address], [place], [place] [address] Phone: [phone] Website: [email or web address] Services Offered: Games developer. Call for details Agency Name: [facility] Address: [address] [place], [place] Phone: [phone] Website: [email or web address] Service(s) Offered: Emergency Food Pantry. Call for details.  Agency Name: Holiday representative Address: [address], [place], [place] [address] Phone:  [phone] or [phone] Website: [email or web address] Service(s) Offered: Distribute food 9am-11:30 am, Tuesday-Friday, and 1- 3:30pm, Monday-Friday. Food pantry Monday-Friday  1pm-3pm, fresh items, Mon.-Wed.-Fri.  Agency Name: [facility] Address: [address] [place], [place] [address] Phone: [phone] Website: [email or web address] Services Offered: Distribute food Tues and Sats from 9:00am-noon. Closed  1st Saturday of each month. Call for details

## 2022-02-13 NOTE — TOC Transition Note (Addendum)
Transition of Care [facility]) - CM/SW Discharge Note   Patient Details  Name: Sue Richardson MRN: [id number] Date of Birth: 08-Aug-1955  Transition of Care [facility]) CM/SW Contact:  Kemper Durie, RN Phone Number: 02/13/2022, 11:40 AM   Clinical Narrative:     Patient admitted to hospital on 9/8 after a fall.  Prior to admission, lives at home with son.  Has walker and BSC in the home.  She was recently active with [facility] home health for PT, discharged last week.  Aware of the need for HHPT again, would like to restart with [facility] home health. Call placed to [facility], spoke with Baxter Hire, state they will process referral tomorrow.    Patient uses [facility] for PCP care.  Received medications from [facility], encouraged to use GoodRx if medications are costly (no new medications today, she is concerned about potential cost in the future).  She expresses concern for ability to pay for utilities and food.  Resources for both provided.  Update 1222: Spoke with Ern[facility] at [facility], notified that patient is still active for PT.  Orders faxed to resume services.    Final next level of care: Home w Home Health Services Barriers to Discharge: Barriers Resolved   Patient Goals and CMS Choice Patient states their goals for this hospitalization and ongoing recovery are:: Home with home health CMS Medicare.gov Compare Post Acute Care list provided to:: Patient Choice offered to / list presented to : Patient  Discharge Placement                       Discharge Plan and Services     Post Acute Care Choice: Home Health                    HH Arranged: PT [facility] Agency: [facility] Date [facility] Agency Contacted: 02/13/22 Time HH Agency Contacted: 1139 Representative spoke with at [facility] Agency: Baxter Hire  Social Determinants of Health (SDOH) Interventions Food Insecurity Interventions: Inpatient TOC Utilities Interventions: Inpatient TOC   Readmission Risk  Interventions     No data to display

## 2022-02-13 NOTE — Discharge Summary (Signed)
Physician Discharge Summary   Patient: Sue Richardson MRN: [id number] DOB: 07/30/1955  Admit date:     02/11/2022  Discharge date: 02/13/22  Discharge Physician: Enedina Finner   PCP: System, Provider Not In   Recommendations at discharge:    Keep your appt with [facility] ortho as per instructions F/u PCP in 1-2 weeks  Discharge Diagnoses: acute renal failure resolved accidental fall with small left occipital hematoma history of left shoulder arthroplasty  Hospital Course: Sue Richardson is a 66 y.o. 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  She had a fall while coming in from her porch when she lost her balance on a step and fell backwards hitting her head.    Left shoulder x-ray showed her left shoulder replacement without significant abnormality.   T-spine CT showed no traumatic injury  lumbar spine CT showed mild degenerative changes with no acute injury. C-spine CT showed no acute abnormalities  CT head  revealed 2 small extracranial hematomas overlying the left occipital area with no acute findings otherwise.   Acute renal failure suspect combination of BP meds, pain meds and dehydration accidental fall left occipital hematoma post fall -- patient was found to be dehydrated with elevated creatinine 1.31---received IV fluids .8 -- will discontinue IV fluids. Encourage patient to eat and drink well -patient ambulated-with physical therapy. They recommend home health PT -- patient feels much better today. Worked with physical therapy.    Dizziness -- PRN meclizine   Left shoulder arthroplasty done at [facility] in July 2023 -- patient recently saw Dr Olga Millers at [facility] orthopedic and he is aware of x-ray changes -- plan is to  follow-up in one month orthopedic strays and continue PT -prescribed diclofenac- --patient's home med list has Celebrex, Mobic, diclofenac, oxycodone, ultram listed---discussed with patient's son Ivin Booty and will have to tweak her pain meds  --will cont diclofenac and prn oxycodone at d/c   Relative hypotension history of hypertension -- bp better. Ok to take HCTZ. D/c lasix for now   Hypokalemia --repleted  Patient will discharged to home. She is in agreement.   procedures:none Family communication : son Ivin Booty on the phone discharge plan Consults : none CODE STATUS: full DVT Prophylaxis : enoxaparin       Disposition: Home health Diet recommendation:  Discharge Diet Orders (From admission, onward)     Start     Ordered   02/13/22 0000  Diet - low sodium heart healthy        02/13/22 1047           Cardiac diet DISCHARGE MEDICATION: Allergies as of 02/13/2022   No Known Allergies      Medication List     STOP taking these medications    celecoxib 100 MG capsule Commonly known as: CELEBREX   furosemide 20 MG tablet Commonly known as: LASIX   meloxicam 15 MG tablet Commonly known as: MOBIC       TAKE these medications    amLODipine 5 MG tablet Commonly known as: NORVASC Take 5 mg by mouth daily. What changed: Another medication with the same name was removed. Continue taking this medication, and follow the directions you see here.   atorvastatin 20 MG tablet Commonly known as: LIPITOR Take 1 tablet (20 mg total) by mouth daily.  diclofenac 75 MG EC tablet Commonly known as: VOLTAREN Take 75 mg by mouth 2 (two) times daily.   gabapentin 600 MG tablet Commonly known as: NEURONTIN Take 1 tablet by mouth in the morning and at bedtime.   hydrochlorothiazide 25 MG tablet Commonly known as: HYDRODIURIL Take 25 mg by mouth daily.   Iron 325 (65 Fe) MG Tabs Take 1 tablet by mouth every other day.   oxyCODONE 5 MG immediate release  tablet Commonly known as: Oxy IR/ROXICODONE Take 5-10 mg by mouth every 6 (six) hours as needed.   pantoprazole 40 MG tablet Commonly known as: PROTONIX Take 40 mg by mouth daily.   traMADol 50 MG tablet Commonly known as: ULTRAM Take 50-100 mg by mouth every 6 (six) hours as needed.        Follow-up Information     Carin Primrose, MD. Go to.   Specialty: Orthopedic Surgery Why: on your f/u appt Contact information: [address] [place] [address] [phone]                Discharge Exam: Ceasar Mons Weights   02/11/22 2049  Weight: 78.9 kg     Condition at discharge: fair  The results of significant diagnostics from this hospitalization (including imaging, microbiology, ancillary and laboratory) are listed below for reference.   Imaging Studies: DG Shoulder Left  Result Date: 02/11/2022 CLINICAL DATA:  Shoulder pain after fall, recent surgery EXAM: LEFT SHOULDER - 2+ VIEW COMPARISON:  None Available. FINDINGS: AC joint appears intact. Left proximal humerus prosthesis. Metallic plate along the glenoid, not completely flush with the surface of the bone on AP view. No definitive fracture is seen. Possible small amount of periosteal new bone formation at the inferior cortical margin of proximal humerus which may be due to healing postsurgical change or healing of previous fracture. The left apex is clear IMPRESSION: Left shoulder replacement. No acute fracture seen. Small metallic plate in the region of the glenoid, appears separated from cortical surface of the bone on AP view, correlation with previous postoperative images is recommended to assess for change in alignment or potential hardware complication. Electronically Signed   By: Jasmine Pang M.D.   On: 02/11/2022 23:57   CT Thoracic Spine Wo Contrast  Result Date: 02/11/2022 CLINICAL DATA:  Trauma/fall EXAM: CT THORACIC SPINE WITHOUT CONTRAST TECHNIQUE: Multidetector CT images of the thoracic  were obtained using the standard protocol without intravenous contrast. RADIATION DOSE REDUCTION: This exam was performed according to the departmental dose-optimization program which includes automated exposure control, adjustment of the mA and/or kV according to patient size and/or use of iterative reconstruction technique. COMPARISON:  None Available. FINDINGS: Alignment: Normal thoracic kyphosis. Vertebrae: No acute fracture or focal pathologic process. Paraspinal and other soft tissues: Vascular calcifications. No suspicious pulmonary nodules. Disc levels: Mild degenerative changes at T12-L1. Spinal canal is patent. IMPRESSION: No traumatic injury to the thoracic spine. Electronically Signed   By: Charline Bills M.D.   On: 02/11/2022 22:33   CT Lumbar Spine Wo Contrast  Result Date: 02/11/2022 CLINICAL DATA:  Trauma/fall EXAM: CT LUMBAR SPINE WITHOUT CONTRAST TECHNIQUE: Multidetector CT imaging of the lumbar spine was performed without intravenous contrast administration. Multiplanar CT image reconstructions were also generated. RADIATION DOSE REDUCTION: This exam was performed according to the departmental dose-optimization program which includes automated exposure control, adjustment of the mA and/or kV according to patient size and/or use of iterative reconstruction technique. COMPARISON:  CT abdomen/pelvis dated 05/15/2010 FINDINGS: Segmentation:  5 lumbar type vertebral bodies. Alignment: Normal lumbar lordosis. Vertebrae: No acute fracture or focal pathologic process. Paraspinal and other soft tissues: Vascular calcifications. Status post hysterectomy. No adnexal masses. Disc levels: Mild degenerative changes at T12-L1, L4-5, and L5-S1. Spinal canal is patent. IMPRESSION: No traumatic injury to the lumbar spine. Mild degenerative changes. Electronically Signed   By: Charline Bills M.D.   On: 02/11/2022 22:31   CT Head Wo Contrast  Result Date: 02/11/2022 CLINICAL DATA:  Trauma/fall EXAM: CT  HEAD WITHOUT CONTRAST CT CERVICAL SPINE WITHOUT CONTRAST TECHNIQUE: Multidetector CT imaging of the head and cervical spine was performed following the standard protocol without intravenous contrast. Multiplanar CT image reconstructions of the cervical spine were also generated. RADIATION DOSE REDUCTION: This exam was performed according to the departmental dose-optimization program which includes automated exposure control, adjustment of the mA and/or kV according to patient size and/or use of iterative reconstruction technique. COMPARISON:  CT head dated 12/24/2015 FINDINGS: CT HEAD FINDINGS Brain: No evidence of acute infarction, hemorrhage, hydrocephalus, extra-axial collection or mass lesion/mass effect. Vascular: Mild intracranial atherosclerosis. Skull: Normal. Negative for fracture or focal lesion. Sinuses/Orbits: The visualized paranasal sinuses are essentially clear. The mastoid air cells are unopacified. Other: Two small extracranial hematomas overlying the left occipital bone (series 2/image 17). CT CERVICAL SPINE FINDINGS Alignment: Reversal the normal cervical lordosis, likely positional. Skull base and vertebrae: No acute fracture. No primary bone lesion or focal pathologic process. Soft tissues and spinal canal: No prevertebral fluid or swelling. No visible canal hematoma. Disc levels: Mild degenerative changes of the mid/lower cervical spine. Spinal canal is patent. Upper chest: Visualized lung apices are clear. Other: Visualized thyroid is unremarkable. IMPRESSION: Two small extracranial hematomas overlying the left occipital bone. No evidence of calvarial fracture. No evidence of acute intracranial abnormality. No evidence of traumatic injury to the cervical spine. Mild degenerative changes. Electronically Signed   By: Charline Bills M.D.   On: 02/11/2022 22:29   CT Cervical Spine Wo Contrast  Result Date: 02/11/2022 CLINICAL DATA:  Trauma/fall EXAM: CT HEAD WITHOUT CONTRAST CT CERVICAL  SPINE WITHOUT CONTRAST TECHNIQUE: Multidetector CT imaging of the head and cervical spine was performed following the standard protocol without intravenous contrast. Multiplanar CT image reconstructions of the cervical spine were also generated. RADIATION DOSE REDUCTION: This exam was performed according to the departmental dose-optimization program which includes automated exposure control, adjustment of the mA and/or kV according to patient size and/or use of iterative reconstruction technique. COMPARISON:  CT head dated 12/24/2015 FINDINGS: CT HEAD FINDINGS Brain: No evidence of acute infarction, hemorrhage, hydrocephalus, extra-axial collection or mass lesion/mass effect. Vascular: Mild intracranial atherosclerosis. Skull: Normal. Negative for fracture or focal lesion. Sinuses/Orbits: The visualized paranasal sinuses are essentially clear. The mastoid air cells are unopacified. Other: Two small extracranial hematomas overlying the left occipital bone (series 2/image 17). CT CERVICAL SPINE FINDINGS Alignment: Reversal the normal cervical lordosis, likely positional. Skull base and vertebrae: No acute fracture. No primary bone lesion or focal pathologic process. Soft tissues and spinal canal: No prevertebral fluid or swelling. No visible canal hematoma. Disc levels: Mild degenerative changes of the mid/lower cervical spine. Spinal canal is patent. Upper chest: Visualized lung apices are clear. Other: Visualized thyroid is unremarkable. IMPRESSION: Two small extracranial hematomas overlying the left occipital bone. No evidence of calvarial fracture. No evidence of acute intracranial abnormality. No evidence of traumatic injury to the cervical spine. Mild degenerative changes. Electronically Signed   By: Charline Bills M.D.   On:  02/11/2022 22:29    Microbiology: No results found for this or any previous visit.  Labs: CBC: Recent Labs  Lab 02/11/22 2216 02/12/22 0637  WBC 7.6 5.1  NEUTROABS 5.6  --    HGB 10.6* 9.9*  HCT 34.4* 31.8*  MCV 77.5* 76.3*  PLT 349 331   Basic Metabolic Panel: Recent Labs  Lab 02/11/22 2216 02/12/22 0637  NA 138 140  K 3.3* 3.9  CL 104 106  CO2 26 27  GLUCOSE 128* 103*  BUN 29* 23  CREATININE 1.13* 0.80  CALCIUM 8.4* 8.5*  MG  --  2.2   Liver Function Tests: Recent Labs  Lab 02/11/22 2216  AST 17  ALT 14  ALKPHOS 73  BILITOT 0.2*  PROT 6.7  ALBUMIN 3.7   CBG: No results for input(s): "GLUCAP" in the last 168 hours.  Discharge time spent: greater than 30 minutes.  Signed: Enedina Finner, MD [facility] 02/13/2022

## 2022-09-08 ENCOUNTER — Emergency Department
Admission: EM | Admit: 2022-09-08 | Discharge: 2022-09-08 | Disposition: A | Discharge status: Home / Self Care | Payer: 59 | Attending: Student in an Organized Health Care Education/Training Program | Admitting: Student in an Organized Health Care Education/Training Program

## 2022-09-08 ENCOUNTER — Emergency Department: Payer: 59

## 2022-09-08 DIAGNOSIS — I10 Essential (primary) hypertension: Secondary | ICD-10-CM | POA: Insufficient documentation

## 2022-09-08 DIAGNOSIS — E0590 Thyrotoxicosis, unspecified without thyrotoxic crisis or storm: Secondary | ICD-10-CM | POA: Insufficient documentation

## 2022-09-08 DIAGNOSIS — R2681 Unsteadiness on feet: Secondary | ICD-10-CM | POA: Insufficient documentation

## 2022-09-08 DIAGNOSIS — R42 Dizziness and giddiness: Secondary | ICD-10-CM | POA: Diagnosis not present

## 2022-09-08 DIAGNOSIS — R2689 Other abnormalities of gait and mobility: Secondary | ICD-10-CM

## 2022-09-08 LAB — CBC
HCT: 39.2 % (ref 36.0–46.0)
Hemoglobin: 12.1 g/dL (ref 12.0–15.0)
MCH: 23.6 pg — ABNORMAL LOW (ref 26.0–34.0)
MCHC: 30.9 g/dL (ref 30.0–36.0)
MCV: 76.6 fL — ABNORMAL LOW (ref 80.0–100.0)
Platelets: 307 10*3/uL (ref 150–400)
RBC: 5.12 MIL/uL — ABNORMAL HIGH (ref 3.87–5.11)
RDW: 18.5 % — ABNORMAL HIGH (ref 11.5–15.5)
WBC: 6.2 10*3/uL (ref 4.0–10.5)
nRBC: 0 % (ref 0.0–0.2)

## 2022-09-08 LAB — BASIC METABOLIC PANEL
Anion gap: 9 (ref 5–15)
BUN: 15 mg/dL (ref 8–23)
CO2: 23 mmol/L (ref 22–32)
Calcium: 9.3 mg/dL (ref 8.9–10.3)
Chloride: 109 mmol/L (ref 98–111)
Creatinine, Ser: 0.56 mg/dL (ref 0.44–1.00)
GFR, Estimated: >60 mL/min (ref >60)
Glucose, Bld: 112 mg/dL — ABNORMAL HIGH (ref 70–99)
Potassium: 3.9 mmol/L (ref 3.5–5.1)
Sodium: 141 mmol/L (ref 135–145)

## 2022-09-08 LAB — TSH: TSH: 0.052 u[IU]/mL — ABNORMAL LOW (ref 0.350–4.500)

## 2022-09-08 MED ORDER — METHIMAZOLE 10 MG PO TABS: 10.0000 mg | ORAL_TABLET | Freq: Three times a day (TID) | ORAL | 0 refills | Status: AC

## 2022-09-08 NOTE — ED Notes (Signed)
Tv adjusted for pt and her visitor, both given fresh warm blankets and room temp adjusted as requested. Pt calm, not shaking currently, in NAD.

## 2022-09-08 NOTE — ED Notes (Signed)
Called lab to add on T3/4 free. State they need red tube and cannot readily find lt grn tube from earlier so this RN will send fresh tubes down soon.

## 2022-09-08 NOTE — ED Triage Notes (Signed)
Pt sts that she has been having dizziness and shaking for the last several months. Pt sts that her PCP is not giving her any answers. Pt sts that her pcp has not recommended any other doctor to go to.
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
## 2022-09-08 NOTE — Discharge Instructions (Signed)
Follow up with [facility] clinic neurology and endocrinology: call them for an appointment, let them know you were seen in the ER and were told to follow up with them Return to the ER if worsening  Please go to the following website to schedule new (and existing) patient appointments:   [email or web address]   The following is a list of primary care offices in the area who are accepting new patients at this time.  Please reach out to one of them directly and let them know you would like to schedule an appointment to follow up on an Emergency Department visit, and/or to establish a new primary care provider (PCP).  There are likely other primary care clinics in the are who are accepting new patients, but this is an excellent place to start:  [place] physician: Dr Lavon Paganini [address] [place], [place] [address] [phone]  [facility] Lead Physician: Dr Steele Sizer [address], [place], [place] [address] [phone]  [place] Physician: Dr Park Liter [address] [place], [place] [address] [phone]  [facility] Lead Physician: Dr Dewaine Oats [address] [place], [place] [address] [phone]  [facility] Physician: Dr Halina Maidens [address] [place], [place], [place] [address] [phone]

## 2022-09-08 NOTE — ED Notes (Signed)
EKG to EDP Robinson in person.  

## 2022-09-08 NOTE — ED Notes (Signed)
See triage note. Pt reports dizziness and episodes of shakiness for a month+; denies hx of TBI, seizures, DM; denies having recent imaging of head or having had ears checked; denies fever, N/V/D. Resp reg/unlabored, skin dry and sitting calmly in wheelchair though is intermittently silently tearful.

## 2022-09-08 NOTE — ED Notes (Signed)
Pt denies feeling like she needs to urinate currently. No urine sample sent to lab. Pt will follow up outpt.

## 2022-09-08 NOTE — ED Triage Notes (Signed)
First Nurse Note:  Arrives from home via [facility]. C/O HTN.  PMH:  HTN.  175/92 BP Spo2:  100 RA.  Per EMS, the real issue patient shuffles her feet and shakes and forgets things for several months and primary care is not giving answers.  Patient lives home alone, family concerned patient will fall at home and injure self.

## 2022-09-08 NOTE — ED Notes (Signed)
Verbal hold on drawing T3 free and T4 free labs from [facility] who is currently at bedside.

## 2022-10-19 ENCOUNTER — Other Ambulatory Visit: Payer: Self-pay | Admitting: Family Medicine

## 2022-10-19 DIAGNOSIS — R7989 Other specified abnormal findings of blood chemistry: Secondary | ICD-10-CM

## 2022-11-02 ENCOUNTER — Ambulatory Visit
Admission: RE | Admit: 2022-11-02 | Discharge: 2022-11-02 | Disposition: A | Discharge status: Home / Self Care | Payer: 59 | Source: Ambulatory Visit | Attending: Family Medicine | Admitting: Family Medicine

## 2022-11-02 DIAGNOSIS — R7989 Other specified abnormal findings of blood chemistry: Secondary | ICD-10-CM

## 2022-11-17 ENCOUNTER — Other Ambulatory Visit: Payer: Self-pay | Admitting: Family Medicine

## 2022-11-17 DIAGNOSIS — Z1231 Encounter for screening mammogram for malignant neoplasm of breast: Secondary | ICD-10-CM

## 2022-11-30 ENCOUNTER — Inpatient Hospital Stay: Admission: RE | Admit: 2022-11-30 | Payer: 59 | Source: Ambulatory Visit

## 2023-03-08 ENCOUNTER — Ambulatory Visit
Admission: RE | Admit: 2023-03-08 | Discharge: 2023-03-08 | Disposition: A | Discharge status: Home / Self Care | Payer: 59 | Source: Ambulatory Visit | Attending: Family Medicine | Admitting: Family Medicine

## 2023-03-08 DIAGNOSIS — Z1231 Encounter for screening mammogram for malignant neoplasm of breast: Secondary | ICD-10-CM | POA: Diagnosis present

## 2023-03-27 ENCOUNTER — Other Ambulatory Visit: Payer: Self-pay | Admitting: Family Medicine

## 2023-03-27 DIAGNOSIS — M810 Age-related osteoporosis without current pathological fracture: Secondary | ICD-10-CM

## 2023-06-09 ENCOUNTER — Ambulatory Visit
Admission: RE | Admit: 2023-06-09 | Discharge: 2023-06-09 | Disposition: A | Discharge status: Home / Self Care | Payer: 59 | Source: Ambulatory Visit | Attending: Family Medicine | Admitting: Family Medicine

## 2023-06-09 DIAGNOSIS — M810 Age-related osteoporosis without current pathological fracture: Secondary | ICD-10-CM | POA: Insufficient documentation

## 2023-08-21 ENCOUNTER — Other Ambulatory Visit: Payer: Self-pay

## 2023-08-21 ENCOUNTER — Emergency Department
Admission: EM | Admit: 2023-08-21 | Discharge: 2023-08-21 | Disposition: A | Discharge status: Home / Self Care | Attending: Emergency Medicine | Admitting: Emergency Medicine

## 2023-08-21 ENCOUNTER — Emergency Department

## 2023-08-21 DIAGNOSIS — S29019A Strain of muscle and tendon of unspecified wall of thorax, initial encounter: Secondary | ICD-10-CM | POA: Diagnosis not present

## 2023-08-21 DIAGNOSIS — S3991XA Unspecified injury of abdomen, initial encounter: Secondary | ICD-10-CM | POA: Diagnosis present

## 2023-08-21 DIAGNOSIS — Y92410 Unspecified street and highway as the place of occurrence of the external cause: Secondary | ICD-10-CM | POA: Insufficient documentation

## 2023-08-21 DIAGNOSIS — G20C Parkinsonism, unspecified: Secondary | ICD-10-CM | POA: Insufficient documentation

## 2023-08-21 DIAGNOSIS — I10 Essential (primary) hypertension: Secondary | ICD-10-CM | POA: Insufficient documentation

## 2023-08-21 DIAGNOSIS — S299XXA Unspecified injury of thorax, initial encounter: Secondary | ICD-10-CM | POA: Diagnosis present

## 2023-08-21 DIAGNOSIS — M25512 Pain in left shoulder: Secondary | ICD-10-CM | POA: Diagnosis present

## 2023-08-21 LAB — CBC WITH DIFFERENTIAL/PLATELET
Abs Immature Granulocytes: 0.01 10*3/uL (ref 0.00–0.07)
Basophils Absolute: 0 10*3/uL (ref 0.0–0.1)
Basophils Relative: 0 %
Eosinophils Absolute: 0 10*3/uL (ref 0.0–0.5)
Eosinophils Relative: 0 %
HCT: 37.1 % (ref 36.0–46.0)
Hemoglobin: 11.7 g/dL — ABNORMAL LOW (ref 12.0–15.0)
Immature Granulocytes: 0 %
Lymphocytes Relative: 29 %
Lymphs Abs: 1.5 10*3/uL (ref 0.7–4.0)
MCH: 24.2 pg — ABNORMAL LOW (ref 26.0–34.0)
MCHC: 31.5 g/dL (ref 30.0–36.0)
MCV: 76.8 fL — ABNORMAL LOW (ref 80.0–100.0)
Monocytes Absolute: 0.4 10*3/uL (ref 0.1–1.0)
Monocytes Relative: 7 %
Neutro Abs: 3.3 10*3/uL (ref 1.7–7.7)
Neutrophils Relative %: 64 %
Platelets: 320 10*3/uL (ref 150–400)
RBC: 4.83 MIL/uL (ref 3.87–5.11)
RDW: 18.8 % — ABNORMAL HIGH (ref 11.5–15.5)
WBC: 5.2 10*3/uL (ref 4.0–10.5)
nRBC: 0 % (ref 0.0–0.2)

## 2023-08-21 LAB — BASIC METABOLIC PANEL
Anion gap: 9 (ref 5–15)
BUN: 15 mg/dL (ref 8–23)
CO2: 24 mmol/L (ref 22–32)
Calcium: 9.4 mg/dL (ref 8.9–10.3)
Chloride: 107 mmol/L (ref 98–111)
Creatinine, Ser: 0.6 mg/dL (ref 0.44–1.00)
GFR, Estimated: >60 mL/min (ref >60)
Glucose, Bld: 105 mg/dL — ABNORMAL HIGH (ref 70–99)
Potassium: 4.1 mmol/L (ref 3.5–5.1)
Sodium: 140 mmol/L (ref 135–145)

## 2023-08-21 LAB — TROPONIN I (HIGH SENSITIVITY)
Troponin I (High Sensitivity): 6 ng/L (ref <18)
Troponin I (High Sensitivity): 7 ng/L (ref <18)

## 2023-08-21 MED ORDER — DIAZEPAM 2 MG PO TABS: 2.0000 mg | ORAL_TABLET | Freq: Three times a day (TID) | ORAL | 0 refills | Status: AC | PRN

## 2023-08-21 MED ORDER — MORPHINE SULFATE (PF) 4 MG/ML IV SOLN
4.0000 mg | Freq: Once | INTRAVENOUS | Status: AC
  Administered 2023-08-21: 4 mg via INTRAVENOUS
  Filled 2023-08-21: qty 1

## 2023-08-21 MED ORDER — IOHEXOL 300 MG/ML  SOLN
100.0000 mL | Freq: Once | INTRAMUSCULAR | Status: AC | PRN
  Administered 2023-08-21: 100 mL via INTRAVENOUS
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
## 2024-04-23 ENCOUNTER — Encounter: Payer: Self-pay | Admitting: Family Medicine

## 2024-04-23 DIAGNOSIS — Z1231 Encounter for screening mammogram for malignant neoplasm of breast: Secondary | ICD-10-CM
